# Patient Record
Sex: Male | Born: 1975 | Race: Black or African American | Hispanic: No | Marital: Married | Smoking: Former smoker
Health system: Southern US, Community
[De-identification: ages and names within clinical notes are randomized; demographics above are authoritative.]

## PROBLEM LIST (undated history)

## (undated) DIAGNOSIS — G5600 Carpal tunnel syndrome, unspecified upper limb: Secondary | ICD-10-CM

## (undated) DIAGNOSIS — I10 Essential (primary) hypertension: Secondary | ICD-10-CM

## (undated) HISTORY — DX: Essential (primary) hypertension: I10

## (undated) HISTORY — PX: SPINE SURGERY: SHX786

---

## 1997-10-05 ENCOUNTER — Emergency Department (HOSPITAL_COMMUNITY): Admission: EM | Admit: 1997-10-05 | Discharge: 1997-10-05 | Payer: Self-pay | Admitting: Emergency Medicine

## 1997-10-05 ENCOUNTER — Encounter: Payer: Self-pay | Admitting: Emergency Medicine

## 1998-03-08 ENCOUNTER — Emergency Department (HOSPITAL_COMMUNITY): Admission: EM | Admit: 1998-03-08 | Discharge: 1998-03-08 | Payer: Self-pay | Admitting: Emergency Medicine

## 1999-04-30 ENCOUNTER — Encounter: Payer: Self-pay | Admitting: Emergency Medicine

## 1999-04-30 ENCOUNTER — Emergency Department (HOSPITAL_COMMUNITY): Admission: EM | Admit: 1999-04-30 | Discharge: 1999-04-30 | Payer: Self-pay | Admitting: Emergency Medicine

## 1999-12-10 ENCOUNTER — Emergency Department (HOSPITAL_COMMUNITY): Admission: EM | Admit: 1999-12-10 | Discharge: 1999-12-10 | Payer: Self-pay | Admitting: Emergency Medicine

## 1999-12-15 ENCOUNTER — Encounter: Payer: Self-pay | Admitting: Emergency Medicine

## 1999-12-15 ENCOUNTER — Emergency Department (HOSPITAL_COMMUNITY): Admission: EM | Admit: 1999-12-15 | Discharge: 1999-12-15 | Payer: Self-pay | Admitting: Emergency Medicine

## 1999-12-21 ENCOUNTER — Emergency Department (HOSPITAL_COMMUNITY): Admission: EM | Admit: 1999-12-21 | Discharge: 1999-12-21 | Payer: Self-pay | Admitting: Internal Medicine

## 2000-08-01 ENCOUNTER — Encounter: Payer: Self-pay | Admitting: Emergency Medicine

## 2000-08-01 ENCOUNTER — Emergency Department (HOSPITAL_COMMUNITY): Admission: EM | Admit: 2000-08-01 | Discharge: 2000-08-01 | Payer: Self-pay | Admitting: Emergency Medicine

## 2003-06-26 ENCOUNTER — Emergency Department (HOSPITAL_COMMUNITY): Admission: EM | Admit: 2003-06-26 | Discharge: 2003-06-26 | Payer: Self-pay | Admitting: Emergency Medicine

## 2003-12-04 ENCOUNTER — Emergency Department (HOSPITAL_COMMUNITY): Admission: EM | Admit: 2003-12-04 | Discharge: 2003-12-04 | Payer: Self-pay | Admitting: *Deleted

## 2003-12-16 ENCOUNTER — Emergency Department (HOSPITAL_COMMUNITY): Admission: EM | Admit: 2003-12-16 | Discharge: 2003-12-16 | Payer: Self-pay | Admitting: Family Medicine

## 2003-12-24 ENCOUNTER — Emergency Department (HOSPITAL_COMMUNITY): Admission: EM | Admit: 2003-12-24 | Discharge: 2003-12-24 | Payer: Self-pay

## 2004-01-05 ENCOUNTER — Emergency Department (HOSPITAL_COMMUNITY): Admission: EM | Admit: 2004-01-05 | Discharge: 2004-01-05 | Payer: Self-pay | Admitting: Emergency Medicine

## 2005-01-09 ENCOUNTER — Emergency Department (HOSPITAL_COMMUNITY): Admission: EM | Admit: 2005-01-09 | Discharge: 2005-01-09 | Payer: Self-pay | Admitting: Emergency Medicine

## 2005-05-16 ENCOUNTER — Encounter: Payer: Self-pay | Admitting: Emergency Medicine

## 2005-09-23 ENCOUNTER — Emergency Department (HOSPITAL_COMMUNITY): Admission: EM | Admit: 2005-09-23 | Discharge: 2005-09-23 | Payer: Self-pay | Admitting: Family Medicine

## 2005-09-26 ENCOUNTER — Emergency Department (HOSPITAL_COMMUNITY): Admission: EM | Admit: 2005-09-26 | Discharge: 2005-09-26 | Payer: Self-pay

## 2005-09-26 ENCOUNTER — Emergency Department (HOSPITAL_COMMUNITY): Admission: EM | Admit: 2005-09-26 | Discharge: 2005-09-27 | Payer: Self-pay | Admitting: Emergency Medicine

## 2006-05-03 ENCOUNTER — Emergency Department (HOSPITAL_COMMUNITY): Admission: EM | Admit: 2006-05-03 | Discharge: 2006-05-03 | Payer: Self-pay | Admitting: *Deleted

## 2006-10-15 ENCOUNTER — Emergency Department: Payer: Self-pay | Admitting: Emergency Medicine

## 2006-10-15 ENCOUNTER — Other Ambulatory Visit: Payer: Self-pay

## 2007-03-20 ENCOUNTER — Emergency Department (HOSPITAL_COMMUNITY): Admission: EM | Admit: 2007-03-20 | Discharge: 2007-03-21 | Payer: Self-pay | Admitting: Emergency Medicine

## 2007-03-24 ENCOUNTER — Emergency Department (HOSPITAL_COMMUNITY): Admission: EM | Admit: 2007-03-24 | Discharge: 2007-03-24 | Payer: Self-pay | Admitting: Emergency Medicine

## 2007-05-24 ENCOUNTER — Emergency Department (HOSPITAL_COMMUNITY): Admission: EM | Admit: 2007-05-24 | Discharge: 2007-05-24 | Payer: Self-pay | Admitting: Emergency Medicine

## 2007-10-26 ENCOUNTER — Emergency Department (HOSPITAL_COMMUNITY): Admission: EM | Admit: 2007-10-26 | Discharge: 2007-10-26 | Payer: Self-pay | Admitting: Emergency Medicine

## 2007-11-03 ENCOUNTER — Emergency Department (HOSPITAL_COMMUNITY): Admission: EM | Admit: 2007-11-03 | Discharge: 2007-11-03 | Payer: Self-pay | Admitting: Emergency Medicine

## 2008-08-03 ENCOUNTER — Emergency Department (HOSPITAL_COMMUNITY): Admission: EM | Admit: 2008-08-03 | Discharge: 2008-08-03 | Payer: Self-pay | Admitting: Family Medicine

## 2009-08-10 ENCOUNTER — Emergency Department (HOSPITAL_COMMUNITY): Admission: EM | Admit: 2009-08-10 | Discharge: 2009-08-10 | Payer: Self-pay | Admitting: Emergency Medicine

## 2009-08-30 ENCOUNTER — Ambulatory Visit (HOSPITAL_COMMUNITY): Admission: RE | Admit: 2009-08-30 | Discharge: 2009-08-30 | Payer: Self-pay | Admitting: Orthopedic Surgery

## 2009-12-13 ENCOUNTER — Emergency Department (HOSPITAL_COMMUNITY): Admission: EM | Admit: 2009-12-13 | Discharge: 2009-12-13 | Payer: Self-pay | Admitting: Emergency Medicine

## 2010-01-15 HISTORY — PX: BACK SURGERY: SHX140

## 2010-01-30 LAB — DIFFERENTIAL
Basophils Absolute: 0 10*3/uL (ref 0.0–0.1)
Basophils Relative: 0 % (ref 0–1)
Eosinophils Absolute: 0.1 10*3/uL (ref 0.0–0.7)
Eosinophils Relative: 1 % (ref 0–5)
Lymphocytes Relative: 48 % — ABNORMAL HIGH (ref 12–46)
Lymphs Abs: 2.2 10*3/uL (ref 0.7–4.0)
Monocytes Absolute: 0.3 10*3/uL (ref 0.1–1.0)
Monocytes Relative: 7 % (ref 3–12)
Neutro Abs: 2.1 10*3/uL (ref 1.7–7.7)
Neutrophils Relative %: 44 % (ref 43–77)

## 2010-01-30 LAB — URINALYSIS, ROUTINE W REFLEX MICROSCOPIC
Bilirubin Urine: NEGATIVE
Hgb urine dipstick: NEGATIVE
Ketones, ur: NEGATIVE mg/dL
Nitrite: NEGATIVE
Protein, ur: NEGATIVE mg/dL
Specific Gravity, Urine: 1.011 (ref 1.005–1.030)
Urine Glucose, Fasting: NEGATIVE mg/dL
Urobilinogen, UA: 0.2 mg/dL (ref 0.0–1.0)
pH: 6.5 (ref 5.0–8.0)

## 2010-01-30 LAB — COMPREHENSIVE METABOLIC PANEL
ALT: 34 U/L (ref 0–53)
AST: 21 U/L (ref 0–37)
Albumin: 4.3 g/dL (ref 3.5–5.2)
Alkaline Phosphatase: 57 U/L (ref 39–117)
BUN: 10 mg/dL (ref 6–23)
CO2: 27 mEq/L (ref 19–32)
Calcium: 9.4 mg/dL (ref 8.4–10.5)
Chloride: 104 mEq/L (ref 96–112)
Creatinine, Ser: 1.19 mg/dL (ref 0.4–1.5)
GFR calc Af Amer: >60 mL/min (ref >60)
GFR calc non Af Amer: >60 mL/min (ref >60)
Glucose, Bld: 74 mg/dL (ref 70–99)
Potassium: 4.5 mEq/L (ref 3.5–5.1)
Sodium: 138 mEq/L (ref 135–145)
Total Bilirubin: 0.7 mg/dL (ref 0.3–1.2)
Total Protein: 7.5 g/dL (ref 6.0–8.3)

## 2010-01-30 LAB — SURGICAL PCR SCREEN
MRSA, PCR: NEGATIVE
Staphylococcus aureus: NEGATIVE

## 2010-01-30 LAB — CBC
HCT: 45.6 % (ref 39.0–52.0)
Hemoglobin: 15.2 g/dL (ref 13.0–17.0)
MCH: 27.5 pg (ref 26.0–34.0)
MCHC: 33.3 g/dL (ref 30.0–36.0)
MCV: 82.6 fL (ref 78.0–100.0)
Platelets: 253 10*3/uL (ref 150–400)
RBC: 5.52 MIL/uL (ref 4.22–5.81)
RDW: 13.7 % (ref 11.5–15.5)
WBC: 4.7 10*3/uL (ref 4.0–10.5)

## 2010-01-30 LAB — APTT: aPTT: 40 seconds — ABNORMAL HIGH (ref 24–37)

## 2010-01-30 LAB — PROTIME-INR
INR: 0.99 (ref 0.00–1.49)
Prothrombin Time: 13.3 seconds (ref 11.6–15.2)

## 2010-01-31 ENCOUNTER — Observation Stay (HOSPITAL_COMMUNITY)
Admission: RE | Admit: 2010-01-31 | Discharge: 2010-02-02 | Payer: Self-pay | Source: Home / Self Care | Attending: Orthopedic Surgery | Admitting: Orthopedic Surgery

## 2010-01-31 ENCOUNTER — Encounter (INDEPENDENT_AMBULATORY_CARE_PROVIDER_SITE_OTHER): Payer: Self-pay | Admitting: Orthopedic Surgery

## 2010-02-01 LAB — TYPE AND SCREEN
ABO/RH(D): O POS
Antibody Screen: NEGATIVE

## 2010-02-01 LAB — ABO/RH: ABO/RH(D): O POS

## 2010-02-06 NOTE — Op Note (Signed)
Edwin Cline, Edwin Cline              ACCOUNT NO.:  [id number]  MEDICAL RECORD NO.:  [id number]          PATIENT TYPE:  OIB  LOCATION:  1317                         FACILITY:  [facility]  PHYSICIAN:  Georges Lynch. Cameo Schmiesing, M.D.DATE OF BIRTH:  1975-04-12  DATE OF PROCEDURE:  01/31/2010 DATE OF DISCHARGE:                              OPERATIVE REPORT   SURGEON:  Georges Lynch. Darrelyn Hillock, M.D.  ASSISTANT:  Marlowe Kays, M.D.  PREOPERATIVE DIAGNOSES: 1. Lateral recess stenosis L5-S1, left. 2. Herniated disk, L5-S1, left. 3. 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  POSTOPERATIVE DIAGNOSES: 1. Lateral recess stenosis L5-S1, left. 2. Herniated disk, L5-S1, left. 3. 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  OPERATIONS: 1. Decompressive lateral recess at L5-S1 on the left for lateral     recess stenosis. 2. Microdiskectomy at L5-S1 on the left herniated disk.  PROCEDURE:  Under general anesthesia, routine orthopedic prep anddraping was carried out of the lower back.  The patient was on spinal frame.  Note his back was marked on the left side for the appropriate marking.  Appropriate time-out was carried out.  He had 2 grams of IV Ancef.  After the prep and drape was done, 2 needles were placed in the back for localization purposes.  X-ray was taken.  Following that an incision was made over the L5-S1 interspace.  Bleeders identified and cauterized.  The muscle was separated bilaterally from the lamina and spinous processes mainly for retraction purposes.  The McCullough retractors then were inserted.  We identified the L5-S1 space.  A hemilaminectomy was carried out in the usual fashion.  We did bring the microscope in, removed the ligamentum flavum, protected the dura, and then went out far laterally to decompress the recess.  Was noted at this time that he had a large lateral recess veins that were carefully cauterized and had a small conjoined root.  We identified the  disk space.  A needle was placed in this, an x-ray was taken to verified that we were at L5-S1.  The films were labeled in the operating room for verification.  They were labeled by the radiologist.  At this time, a cruciate incision was made in the posterior longitudinal ligament and a complete microdiskectomy was carried out.  We went up under the ligamentum flavum distally, proximally, medial, and laterally and made sure the root was free.  Went out the lateral recesses root foramen as well.  Following that we made sure that the root was free, did a nice foraminotomy for the S1 root.  We are able to easily pass a hockey-stick down the foramen after we completed the procedure.  Thoroughly irrigated out the area and then loosely applied some thrombin-soaked Gelfoam, closed the wound layers usual fashion except for small deep distal and proximal part of the wound open for drainage purposes.  Subcu was closed with Vicryl, the skin with metal staples, and a sterile Neosporin dressing was applied.  The patient left the operative room in satisfactory condition.          ______________________________ Georges Lynch Darrelyn Hillock, M.D.     RAG/MEDQ  D:  01/31/2010  T:  01/31/2010  Job:  [id number]  Electronically Signed by Ranee Gosselin M.D. on 02/06/2010 11:51:33 AM

## 2010-04-18 ENCOUNTER — Emergency Department (HOSPITAL_COMMUNITY)
Admission: EM | Admit: 2010-04-18 | Discharge: 2010-04-18 | Disposition: A | Discharge status: Home / Self Care | Payer: Self-pay | Attending: Emergency Medicine | Admitting: Emergency Medicine

## 2010-04-18 DIAGNOSIS — R059 Cough, unspecified: Secondary | ICD-10-CM | POA: Insufficient documentation

## 2010-04-18 DIAGNOSIS — G8929 Other chronic pain: Secondary | ICD-10-CM | POA: Insufficient documentation

## 2010-04-18 DIAGNOSIS — R0982 Postnasal drip: Secondary | ICD-10-CM | POA: Insufficient documentation

## 2010-04-18 DIAGNOSIS — J069 Acute upper respiratory infection, unspecified: Secondary | ICD-10-CM | POA: Insufficient documentation

## 2010-04-18 DIAGNOSIS — R05 Cough: Secondary | ICD-10-CM | POA: Insufficient documentation

## 2010-04-18 DIAGNOSIS — IMO0001 Reserved for inherently not codable concepts without codable children: Secondary | ICD-10-CM | POA: Insufficient documentation

## 2010-04-18 DIAGNOSIS — R5381 Other malaise: Secondary | ICD-10-CM | POA: Insufficient documentation

## 2010-04-18 DIAGNOSIS — M549 Dorsalgia, unspecified: Secondary | ICD-10-CM | POA: Insufficient documentation

## 2010-04-18 DIAGNOSIS — J3489 Other specified disorders of nose and nasal sinuses: Secondary | ICD-10-CM | POA: Insufficient documentation

## 2010-05-01 NOTE — H&P (Addendum)
Edwin Cline, Edwin Cline              ACCOUNT NO.:  [id number]  MEDICAL RECORD NO.:  [id number]           PATIENT TYPE:  LOCATION:                                 FACILITY:  PHYSICIAN:  Georges Lynch. Hiedi Touchton, M.D.DATE OF BIRTH:  01-30-1975  DATE OF ADMISSION: DATE OF DISCHARGE:                             HISTORY & PHYSICAL   ADMISSION DIAGNOSIS:  Lumbar disk herniation.  DISCHARGE DIAGNOSIS:  Lumbar disk herniation status post decompressive lateral recess at L5-S1 on the left and microdiskectomy at L5-S1 also on the left for disk herniation.  LABORATORY DATA:  Preoperative CBC revealed a white count of 4.7, hemoglobin 15.2, hematocrit 45.6 and a platelet count of 253. Preoperative INR is 0.99.  Preoperative chemistry panel is unremarkable. Preoperative urinalysis was negative for infection and preoperative PCR for MRSA and staph aureus were both negative.  PROCEDURE:  On January 31, 2010, Mr. Melhorn was taken to the operating room by surgeon Dr. Ranee Gosselin, assistant Dr. Marlowe Kays.  He underwent decompressive lateral recess at L5-S1 on the left for lateral recess stenosis and microdiskectomy at L5-S1 also on the left for disk herniation.  The procedure was performed under general anesthesia. Routine orthopedic prep and drape were carried out.  The patient received 2 g IV Ancef preoperatively.  There were no complications with the procedure.  The patient was returned to the recovery room in satisfactory condition.  HOSPITAL COURSE:  Mr. Almena was admitted to [facility] on January 31, 2010, underwent the above-stated procedure without complication.  After adequate time in the recovery room, he was taken to the floor for further recovery.  He was placed on reduced dose PCA Dilaudid and Robaxin for pain control.  On postoperative day #1, the patient was having quite a bit of trouble with nausea and vomiting throughout the night.  We discontinued the PCA Dilaudid  as this may have been the contributor to his nausea and vomiting.  We also changed his antibiotics from Zofran to Phenergan.  The patient was visited by physical therapy on postoperative day #1.  He was able to ambulate 100 feet using a rolling walker without assistance.  He was able to transfer from sit to stand without assistance.  Postoperative day #2, the patient continued to improve.  His nausea and vomiting had resolved.  Pain was decreased and plan was discharge to home on February 02, 2010.  DISPOSITION:  To home on February 02, 2010.  DISCHARGE MEDICATIONS.: 1. Percocet 10/650 mg 1 tablet p.o. q. 4-6 hours p.r.n. pain. 2. Robaxin 500 mg 1 tablet p.o. q.6 h p.r.n. muscle spasm.  DISCHARGE INSTRUCTIONS:  ACTIVITY:  The patient is to increase his activity slowly.  He should ambulate using a walker.  He is able to shower.  DIET:  No restrictions.  WOUND CARE:  He will need daily dressing change. He should cover the wound with Saran wrap for the next 3 days while he is showering to avoid completely submerging the wound.  FOLLOWUP:  The patient will follow up in the office Dr. Darrelyn Hillock 2 weeks from the day of surgery.  Please contact the office  at [phone] to schedule this appointment.  CONDITION ON DISCHARGE:  Improving.     Rozell Searing, PAC   ______________________________ Georges Lynch Darrelyn Hillock, M.D.    LD/MEDQ  D:  04/27/2010  T:  04/27/2010  Job:  [id number]  Electronically Signed by Rozell Searing  on 05/01/2010 02:23:04 PM Electronically Signed by Ranee Gosselin M.D. on 05/04/2010 08:06:26 AM

## 2010-05-11 ENCOUNTER — Emergency Department (HOSPITAL_COMMUNITY)
Admission: EM | Admit: 2010-05-11 | Discharge: 2010-05-11 | Disposition: A | Discharge status: Home / Self Care | Payer: Self-pay | Attending: Emergency Medicine | Admitting: Emergency Medicine

## 2010-05-11 DIAGNOSIS — K029 Dental caries, unspecified: Secondary | ICD-10-CM | POA: Insufficient documentation

## 2010-05-11 DIAGNOSIS — I10 Essential (primary) hypertension: Secondary | ICD-10-CM | POA: Insufficient documentation

## 2010-05-11 DIAGNOSIS — K047 Periapical abscess without sinus: Secondary | ICD-10-CM | POA: Insufficient documentation

## 2010-10-09 LAB — BASIC METABOLIC PANEL
CO2: 26
Chloride: 104
GFR calc Af Amer: >60
Sodium: 137

## 2010-10-09 LAB — COMPREHENSIVE METABOLIC PANEL
ALT: 21
AST: 37
Albumin: 3.8
Alkaline Phosphatase: 47
BUN: 11
CO2: 25
Calcium: 8.7
Chloride: 103
Creatinine, Ser: 1.16
GFR calc Af Amer: >60
GFR calc non Af Amer: >60
Glucose, Bld: 103 — ABNORMAL HIGH
Potassium: 5.4 — ABNORMAL HIGH
Sodium: 134 — ABNORMAL LOW
Total Bilirubin: 1.3 — ABNORMAL HIGH
Total Protein: 6.5

## 2010-10-09 LAB — DIFFERENTIAL
Basophils Relative: 0
Basophils Relative: 0
Eosinophils Absolute: 0
Eosinophils Relative: 1
Lymphs Abs: 1.2
Monocytes Absolute: 0.4
Monocytes Absolute: 0.2
Monocytes Relative: 8
Monocytes Relative: 3
Neutro Abs: 7.2
Neutrophils Relative %: 62

## 2010-10-09 LAB — I-STAT 8, (EC8 V) (CONVERTED LAB)
Bicarbonate: 25.4 — ABNORMAL HIGH
Glucose, Bld: 100 — ABNORMAL HIGH
TCO2: 27
pCO2, Ven: 40.9 — ABNORMAL LOW
pH, Ven: 7.401 — ABNORMAL HIGH

## 2010-10-09 LAB — CBC
HCT: 44.1
Hemoglobin: 15.3
Hemoglobin: 14.2
MCHC: 34.7
MCHC: 34.2
MCV: 81.9
MCV: 80.2
Platelets: 194
RBC: 5.39
RBC: 5.16
RDW: 13.1
WBC: 4.9
WBC: 8.7

## 2010-10-09 LAB — POTASSIUM: Potassium: 3.8

## 2011-10-27 ENCOUNTER — Encounter (HOSPITAL_COMMUNITY): Payer: Self-pay | Admitting: Emergency Medicine

## 2011-10-27 ENCOUNTER — Emergency Department (HOSPITAL_COMMUNITY)
Admission: EM | Admit: 2011-10-27 | Discharge: 2011-10-27 | Disposition: A | Discharge status: Home / Self Care | Payer: Self-pay | Attending: Emergency Medicine | Admitting: Emergency Medicine

## 2011-10-27 DIAGNOSIS — M549 Dorsalgia, unspecified: Secondary | ICD-10-CM | POA: Insufficient documentation

## 2011-10-27 MED ORDER — METHOCARBAMOL 750 MG PO TABS: 750.0000 mg | ORAL_TABLET | Freq: Four times a day (QID) | ORAL | Status: DC

## 2011-10-27 MED ORDER — OXYCODONE-ACETAMINOPHEN 5-325 MG PO TABS: 2.0000 | ORAL_TABLET | ORAL | Status: DC | PRN

## 2011-10-27 MED ORDER — PREDNISONE 10 MG PO TABS: 20.0000 mg | ORAL_TABLET | Freq: Every day | ORAL | Status: DC

## 2011-10-27 NOTE — ED Provider Notes (Signed)
History     CSN: [id number]  Arrival date & time 10/27/11  4098   First MD Initiated Contact with Patient 10/27/11 0710      Chief Complaint  Patient presents with  . Back Pain    (Consider location/radiation/quality/duration/timing/severity/associated sxs/prior treatment) Patient is a 36 y.o. male presenting with back pain. The history is provided by the patient.  Back Pain    patient here complaining of lower back pain x2 days. History of prior back surgery year ago. Denies any urinary retention or bowel incontinence. Pain starts in his left lower paraspinal area and radiates down his legs. Patient recently started a new job that involves heavy lifting. Patient denies any foot drop. Has been using medications prior to arrival. Denies any hematuria or dysuria. No fever chills  History reviewed. No pertinent past medical history.  Past Surgical History  Procedure Date  . Back surgery     No family history on file.  History  Substance Use Topics  . Smoking status: Never Smoker   . Smokeless tobacco: Not on file  . Alcohol Use: No      Review of Systems  Musculoskeletal: Positive for back pain.  All other systems reviewed and are negative.    Allergies  Review of patient's allergies indicates no known allergies.  Home Medications  No current outpatient prescriptions on file.  BP 121/74  Pulse 66  Temp 98 F (36.7 C)  Resp 16  Wt 200 lb (90.719 kg)  SpO2 99%  Physical Exam  Nursing note and vitals reviewed. Constitutional: He is oriented to person, place, and time. He appears well-developed and well-nourished.  Non-toxic appearance. No distress.  HENT:  Head: Normocephalic and atraumatic.  Eyes: Conjunctivae normal, EOM and lids are normal. Pupils are equal, round, and reactive to light.  Neck: Normal range of motion. Neck supple. No tracheal deviation present. No mass present.  Cardiovascular: Normal rate, regular rhythm and normal heart sounds.  Exam  reveals no gallop.   No murmur heard. Pulmonary/Chest: Effort normal and breath sounds normal. No stridor. No respiratory distress. He has no decreased breath sounds. He has no wheezes. He has no rhonchi. He has no rales.  Abdominal: Soft. Normal appearance and bowel sounds are normal. He exhibits no distension. There is no tenderness. There is no rebound and no CVA tenderness.  Musculoskeletal: Normal range of motion. He exhibits no edema and no tenderness.       Arms: Neurological: He is alert and oriented to person, place, and time. He has normal strength. No cranial nerve deficit or sensory deficit. GCS eye subscore is 4. GCS verbal subscore is 5. GCS motor subscore is 6.  Skin: Skin is warm and dry. No abrasion and no rash noted.  Psychiatric: He has a normal mood and affect. His speech is normal and behavior is normal.    ED Course  Procedures (including critical care time)  Labs Reviewed - No data to display No results found.   No diagnosis found.    MDM  Patient to be treated for muscular skeletal back pain. No acute neurological emergency exists. He will followup with [place] by surgeon as needed        Toy Baker, MD 10/27/11 [phone]

## 2011-10-27 NOTE — ED Notes (Signed)
Pt alert, arrives from home, c/o low back pain, recently changed jobs, states sx last year on back, resp even unlabored, skin pwd, ambulates to triage, steady gait noted

## 2011-11-01 ENCOUNTER — Encounter (HOSPITAL_COMMUNITY): Payer: Self-pay | Admitting: Emergency Medicine

## 2011-11-01 ENCOUNTER — Emergency Department (INDEPENDENT_AMBULATORY_CARE_PROVIDER_SITE_OTHER)
Admission: EM | Admit: 2011-11-01 | Discharge: 2011-11-01 | Disposition: A | Discharge status: Home / Self Care | Payer: Self-pay | Source: Home / Self Care | Attending: Emergency Medicine | Admitting: Emergency Medicine

## 2011-11-01 DIAGNOSIS — IMO0002 Reserved for concepts with insufficient information to code with codable children: Secondary | ICD-10-CM

## 2011-11-01 MED ORDER — OXYCODONE-ACETAMINOPHEN 5-325 MG PO TABS: ORAL_TABLET | ORAL | Status: DC

## 2011-11-01 MED ORDER — CYCLOBENZAPRINE HCL 5 MG PO TABS: 5.0000 mg | ORAL_TABLET | Freq: Three times a day (TID) | ORAL | Status: DC | PRN

## 2011-11-01 MED ORDER — MELOXICAM 15 MG PO TABS: 15.0000 mg | ORAL_TABLET | Freq: Every day | ORAL | Status: DC

## 2011-11-01 NOTE — ED Notes (Signed)
Waiting discharge papers 

## 2011-11-01 NOTE — ED Provider Notes (Signed)
Chief Complaint  Patient presents with  . Tailbone Pain    History of Present Illness:   Edwin Cline is a 36 year old male who presents today with left lower back pain radiating down his left leg. This is been going on since an on-the-job injury in July 2011. He was doing some heavy lifting, moving furniture. 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 He's been followed by Dr. Darrelyn Hillock since then. He had a microdiscectomy in January 2012. Thereafter he had to quit working because of the pain and therefore lost his insurance. Because of that he's not been able to get back to see Dr. Darrelyn Hillock. He describes pain in his left lower back with radiation down the left leg as far as the ankle. The leg feels numb and tingly at times he also has cramping and weakness of the legs. He denies any bladder or bowel complaints, incontinence, or saddle anesthesia. He's had no fever, chills, or unintentional weight loss. Dr. Darrelyn Hillock has been prescribing Flexeril and Percocet which does help some. Since he can't see Dr. Darrelyn Hillock anymore, he needs prescriptions on these. He's not taking any anti-inflammatories.  Review of Systems:  Other than noted above, the patient denies any of the following symptoms: Systemic:  No fever, chills, severe fatigue, or unexplained weight loss. GI:  No abdominal pain, nausea, vomiting, diarrhea, constipation, incontinence of bowel, or blood in stool. GU:  No dysuria, frequency, urgency, or hematuria. No incontinence of urine or difficulty urinating.  M-S:  No neck pain, joint pain, arthritis, or myalgias. Neuro:  No paresthesias, saddle anesthesia, muscular weakness, or progressive neurological deficit.  PMFSH:  Past medical history, family history, social history, meds, and allergies were reviewed. Specifically, there is no history of cancer, major trauma, osteoporosis, immunosuppression, HIV, or IV or injection drug use.   Physical Exam:   Vital signs:  BP 133/74  Pulse 60  Temp 98.3 F (36.8 C) (Oral)   Resp 20  SpO2 100% General:  Alert, oriented, in no distress. Abdomen:  Soft, non-tender.  No organomegaly or mass.  No pulsatile midline abdominal mass or bruit. Back:  There is tenderness to palpation in the left lower back, centering just above the iliac crest extending up to the CVA and down to the sacroiliac. He has a midline laminectomy scar which is well-healed. His back has a fairly good range of motion with 60 of flexion, 10 extension, 20 lateral bending, and 90 of rotation in each direction with pain. Pain is most pronounced with extension. Straight leg raising is positive on the left and negative on the right. Neuro:  Normal muscle strength, sensations and DTRs. Extremities: Pedal pulses were full, there was no edema. Skin:  Clear, warm and dry.  No rash.  Assessment:  The encounter diagnosis was Degenerative disc disease.  Plan:   1.  The following meds were prescribed:   New Prescriptions   CYCLOBENZAPRINE (FLEXERIL) 5 MG TABLET    Take 1 tablet (5 mg total) by mouth 3 (three) times daily as needed for muscle spasms.   MELOXICAM (MOBIC) 15 MG TABLET    Take 1 tablet (15 mg total) by mouth daily.   OXYCODONE-ACETAMINOPHEN (PERCOCET) 5-325 MG PER TABLET    1 to 2 tablets every 6 hours as needed for pain.   2.  The patient was instructed in symptomatic care and handouts were given. The patient was encouraged to find a primary care physician for prescription on his medication. I explained to him that we did not  treat chronic pain with opioid analgesics, but he was given a few Percocet to tide him over until he can get in to see a primary care specialist. 3.  The patient was told to return if becoming worse in any way, if no better in 2 weeks, and given some red flag symptoms that would indicate earlier return. 4.  The patient was encouraged to try to be as active as possible and given some exercises to do followed by moist heat.    Reuben Likes, MD 11/01/11 [phone]

## 2011-11-01 NOTE — ED Notes (Addendum)
Pt c/o lower back pain. Recent back surgery 01/2010 on L5 and S1. Pt states that the pain is controlled when taking pain meds and muscle relaxer but is currently out of meds.   Pt was recently seen in hospital for pain and was prescribed percocet but only given 10 pills.  Pt states that the pain is severe and recently had to stop working because of the constant back pain and is applying for disability.

## 2011-11-21 ENCOUNTER — Emergency Department (HOSPITAL_COMMUNITY)
Admission: EM | Admit: 2011-11-21 | Discharge: 2011-11-21 | Disposition: A | Discharge status: Home / Self Care | Payer: Self-pay | Attending: Emergency Medicine | Admitting: Emergency Medicine

## 2011-11-21 ENCOUNTER — Encounter (HOSPITAL_COMMUNITY): Payer: Self-pay | Admitting: Emergency Medicine

## 2011-11-21 DIAGNOSIS — M549 Dorsalgia, unspecified: Secondary | ICD-10-CM

## 2011-11-21 DIAGNOSIS — M545 Low back pain, unspecified: Secondary | ICD-10-CM | POA: Insufficient documentation

## 2011-11-21 DIAGNOSIS — G8929 Other chronic pain: Secondary | ICD-10-CM | POA: Insufficient documentation

## 2011-11-21 LAB — POCT I-STAT, CHEM 8
Calcium, Ion: 1.2 mmol/L (ref 1.12–1.23)
Creatinine, Ser: 1.3 mg/dL (ref 0.50–1.35)
Glucose, Bld: 91 mg/dL (ref 70–99)
HCT: 47 % (ref 39.0–52.0)
Hemoglobin: 16 g/dL (ref 13.0–17.0)
Potassium: 4.5 mEq/L (ref 3.5–5.1)
TCO2: 25 mmol/L (ref 0–100)

## 2011-11-21 LAB — CBC WITH DIFFERENTIAL/PLATELET
Basophils Relative: 0 % (ref 0–1)
Eosinophils Absolute: 0 10*3/uL (ref 0.0–0.7)
Eosinophils Relative: 1 % (ref 0–5)
Hemoglobin: 14.9 g/dL (ref 13.0–17.0)
Lymphs Abs: 1.7 10*3/uL (ref 0.7–4.0)
MCH: 27.5 pg (ref 26.0–34.0)
MCHC: 33.9 g/dL (ref 30.0–36.0)
MCV: 81 fL (ref 78.0–100.0)
Monocytes Absolute: 0.3 10*3/uL (ref 0.1–1.0)
Monocytes Relative: 6 % (ref 3–12)
Neutrophils Relative %: 55 % (ref 43–77)
RBC: 5.42 MIL/uL (ref 4.22–5.81)

## 2011-11-21 MED ORDER — OXYCODONE-ACETAMINOPHEN 10-325 MG PO TABS: 1.0000 | ORAL_TABLET | ORAL | Status: DC | PRN

## 2011-11-21 MED ORDER — OXYCODONE-ACETAMINOPHEN 5-325 MG PO TABS
2.0000 | ORAL_TABLET | Freq: Once | ORAL | Status: AC
  Administered 2011-11-21: 2 via ORAL
  Filled 2011-11-21: qty 2

## 2011-11-21 MED ORDER — METHOCARBAMOL 500 MG PO TABS: 500.0000 mg | ORAL_TABLET | Freq: Four times a day (QID) | ORAL | Status: DC

## 2011-11-21 MED ORDER — METHOCARBAMOL 500 MG PO TABS
750.0000 mg | ORAL_TABLET | Freq: Once | ORAL | Status: AC
  Administered 2011-11-21: 750 mg via ORAL
  Filled 2011-11-21: qty 2

## 2011-11-21 NOTE — ED Provider Notes (Signed)
History     CSN: [id number]  Arrival date & time 11/21/11  4098   First MD Initiated Contact with Patient 11/21/11 [phone]      Chief Complaint  Patient presents with  . Back Pain    (Consider location/radiation/quality/duration/timing/severity/associated sxs/prior treatment) HPI Comments: Patient is a 36 year old male with a history of chronic back pain that presents with acute on chronic back pain for the past 2 weeks. Patient reports a gradual onset and progression of pain that seems to be exacerbated by heavy lifting and activity at his job. The pain is sore, constant, and severe. The pain is located in his left lower back and radiates down his left leg. The patient reports associated numbness and tingling of left leg. Patient reports pain is well-controlled with Percocet and Flexeril that he has taken previously for chronic pain but recently experienced a period of time where he did not have health insurance and could not get his prescriptions. Patient denies alleviating factors. Patient denies fever/chills, headache, neck pain/stiffness, NVD, chest pain, SOB, abdominal pain, dysuria, saddle paresthesias, bowel/bladder incontinence.   Patient is a 36 y.o. male presenting with back pain.  Back Pain     History reviewed. No pertinent past medical history.  Past Surgical History  Procedure Date  . Back surgery     No family history on file.  History  Substance Use Topics  . Smoking status: Never Smoker   . Smokeless tobacco: Not on file  . Alcohol Use: No      Review of Systems  Musculoskeletal: Positive for back pain.  All other systems reviewed and are negative.    Allergies  Review of patient's allergies indicates no known allergies.  Home Medications   Current Outpatient Rx  Name  Route  Sig  Dispense  Refill  . CYCLOBENZAPRINE HCL 5 MG PO TABS   Oral   Take 1 tablet (5 mg total) by mouth 3 (three) times daily as needed for muscle spasms.   30 tablet   3   . OXYCODONE-ACETAMINOPHEN 5-325 MG PO TABS   Oral   Take 2 tablets by mouth every 4 (four) hours as needed for pain.   10 tablet   0     BP 126/80  Pulse 63  Temp 97.2 F (36.2 C) (Oral)  Resp 16  Ht 6\' 2"  (1.88 m)  Wt 189 lb (85.73 kg)  BMI 24.27 kg/m2  SpO2 98%  Physical Exam  Nursing note and vitals reviewed. Constitutional: He is oriented to person, place, and time. He appears well-developed and well-nourished. No distress.  HENT:  Head: Normocephalic and atraumatic.  Eyes: Conjunctivae normal are normal.  Neck: Normal range of motion. Neck supple.  Cardiovascular: Normal rate, regular rhythm and intact distal pulses.  Exam reveals no gallop and no friction rub.   No murmur heard.      Bilateral lower extremities warm with sufficient capillary refill. Distal pulses intact.   Pulmonary/Chest: Effort normal and breath sounds normal. He has no wheezes. He has no rales. He exhibits no tenderness.  Abdominal: Soft. There is no tenderness.  Musculoskeletal: Normal range of motion.       Straight leg raise positive on the left. Left lumbosacral area tender to palpation lateral of spine. No midline spine tenderness to palpation or step off noted. Bilateral hips and knee have full ROM, non tender and without edema.   Neurological: He is alert and oriented to person, place, and time. Coordination normal.  Strength equal and intact bilaterally. Sensation slightly diminished in left leg. Speech is goal-oriented. Moves limbs without ataxia.   Skin: Skin is warm and dry. He is not diaphoretic.  Psychiatric: He has a normal mood and affect. His behavior is normal.    ED Course  Procedures (including critical care time)   Labs Reviewed  CBC WITH DIFFERENTIAL  POCT I-STAT, CHEM 8   No results found.   1. Chronic back pain       MDM  8:59 AM Patient will have Percocet and Robaxin for back pain. Labs pending to evaluate any electrolyte imbalance that would cause the  patient's leg cramping. If labs are unremarkable, patient can be discharged with pain control. He has a follow up appointment in 5 days for his back pain.   9:41 AM Labs unremarkable for electrolyte abnormalities or anemia. Patient will be discharged with pain medication. He will follow up with his doctor in 5 days. No saddle paresthesias, bladder/bowel incontinence. Patient will return with worsening or concerning symptoms.       Emilia Beck, PA-C 11/21/11 1001

## 2011-11-21 NOTE — ED Notes (Signed)
Patient claims had microdiscectomy in January 2012.  Patient claims he had relief from surgery until first of October with heavy lifting and activity at his job.  Patient claims demanding job that is making him have L back pain with radiation down left leg.

## 2011-11-21 NOTE — ED Provider Notes (Signed)
Medical screening examination/treatment/procedure(s) were performed by non-physician practitioner and as supervising physician I was immediately available for consultation/collaboration.   Romari Gasparro, MD 11/21/11 1553 

## 2011-11-23 IMAGING — CR DG OR PORTABLE SPINE
1 series · 1 of 1 positions shown · non-contrast
Comparison: Lumbar spine plain films 01/24/2010.

CLINICAL DATA: L5-L1 discectomy.

PORTABLE SPINE

[lateral]
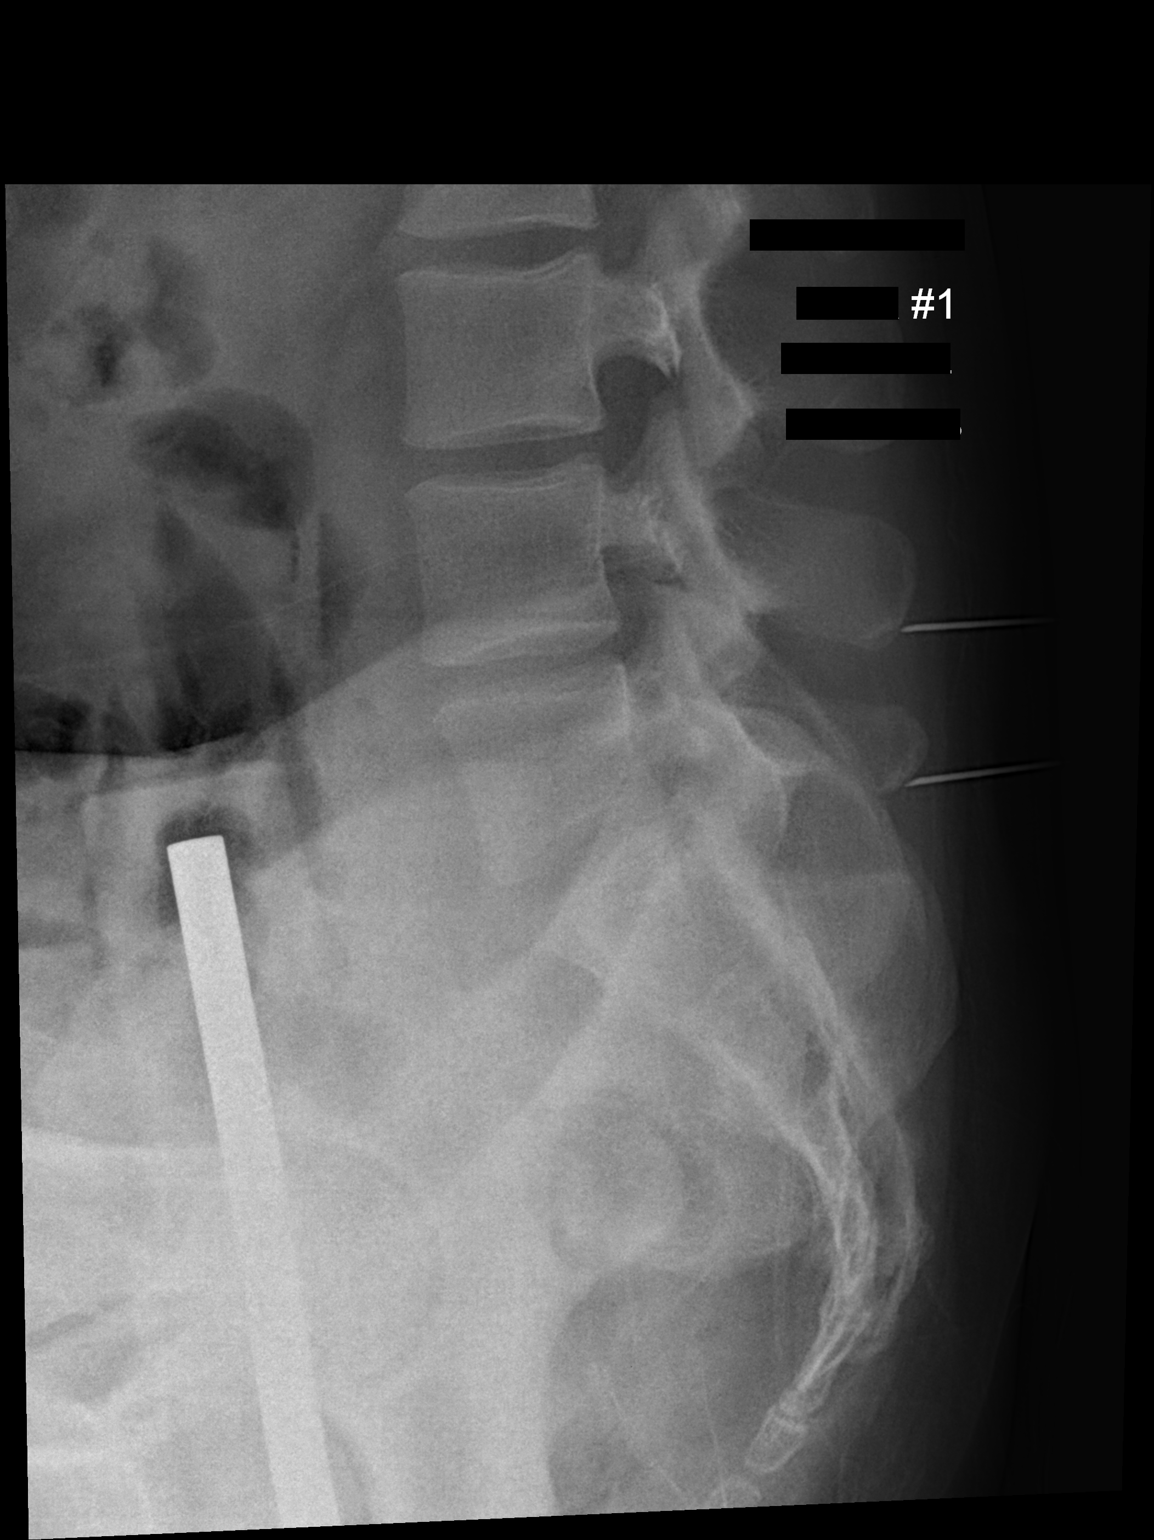

[1 of 1 positions shown; findings below may reference images not displayed]

FINDINGS: We are provided with a single intraoperative view of the
lumbar spine in the lateral projection.  Two metallic probes are in
place.  The more superior is at the level of the inferior aspect of
the L4 spinous process.  The second probe is at the level of the
inferior aspect of the L5 spinous process.
IMPRESSION: Localization as above.

## 2011-11-23 IMAGING — CR DG OR PORTABLE SPINE
1 series · 1 of 1 positions shown · non-contrast
Comparison: Portable films earlier today

CLINICAL DATA: Back pain

PORTABLE SPINE

[lateral]
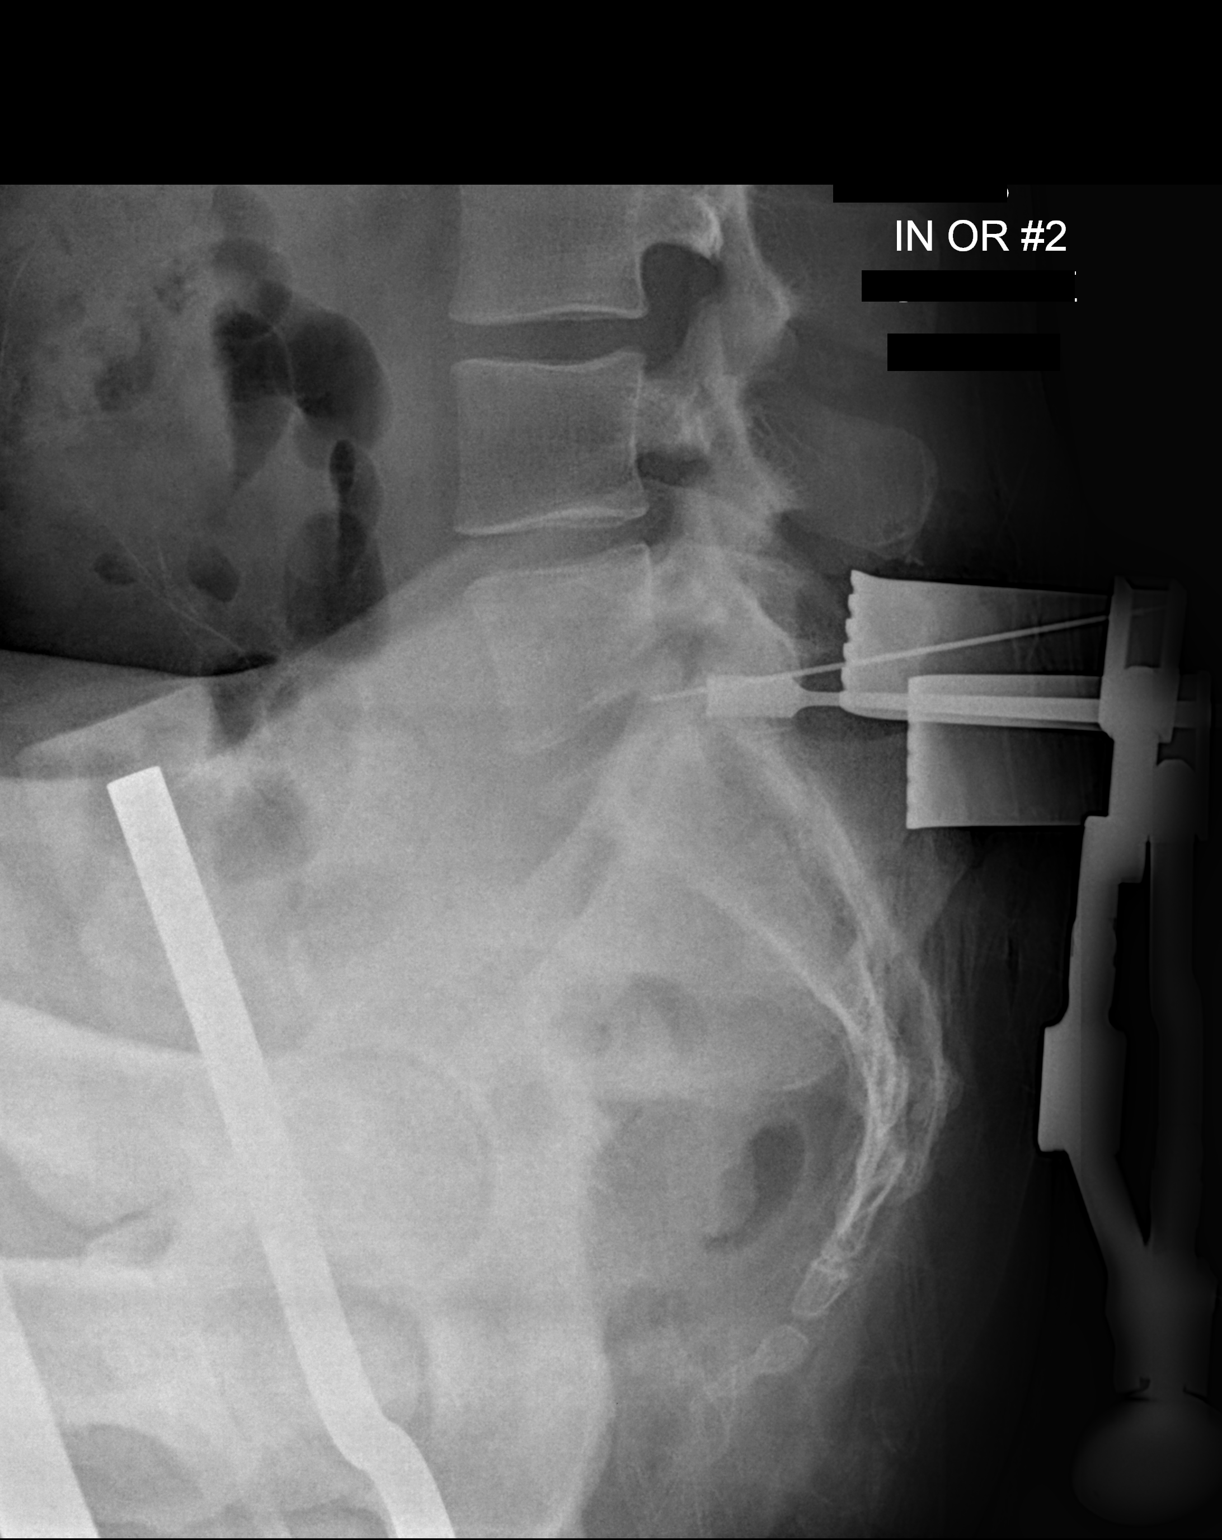

[1 of 1 positions shown; findings below may reference images not displayed]

FINDINGS: Intraoperative lateral film at 2666 hours demonstrates a
needle directed most closely toward the L5-S1 interspace.
IMPRESSION: As above.

## 2011-12-17 ENCOUNTER — Ambulatory Visit: Payer: Self-pay | Admitting: Family Medicine

## 2012-01-29 ENCOUNTER — Emergency Department (HOSPITAL_COMMUNITY)
Admission: EM | Admit: 2012-01-29 | Discharge: 2012-01-29 | Disposition: A | Discharge status: Home / Self Care | Payer: Self-pay | Attending: Emergency Medicine | Admitting: Emergency Medicine

## 2012-01-29 ENCOUNTER — Encounter (HOSPITAL_COMMUNITY): Payer: Self-pay | Admitting: *Deleted

## 2012-01-29 DIAGNOSIS — K047 Periapical abscess without sinus: Secondary | ICD-10-CM | POA: Insufficient documentation

## 2012-01-29 DIAGNOSIS — K0889 Other specified disorders of teeth and supporting structures: Secondary | ICD-10-CM

## 2012-01-29 DIAGNOSIS — K089 Disorder of teeth and supporting structures, unspecified: Secondary | ICD-10-CM | POA: Insufficient documentation

## 2012-01-29 MED ORDER — HYDROCODONE-ACETAMINOPHEN 10-500 MG PO TABS: 1.0000 | ORAL_TABLET | Freq: Four times a day (QID) | ORAL | Status: DC | PRN

## 2012-01-29 MED ORDER — IBUPROFEN 800 MG PO TABS: 800.0000 mg | ORAL_TABLET | Freq: Three times a day (TID) | ORAL | Status: DC | PRN

## 2012-01-29 MED ORDER — PENICILLIN V POTASSIUM 500 MG PO TABS: 500.0000 mg | ORAL_TABLET | Freq: Four times a day (QID) | ORAL | Status: DC

## 2012-01-29 MED ORDER — IBUPROFEN 400 MG PO TABS
800.0000 mg | ORAL_TABLET | Freq: Once | ORAL | Status: AC
  Administered 2012-01-29: 800 mg via ORAL
  Filled 2012-01-29: qty 2

## 2012-01-29 MED ORDER — OXYCODONE-ACETAMINOPHEN 5-325 MG PO TABS
1.0000 | ORAL_TABLET | Freq: Once | ORAL | Status: AC
  Administered 2012-01-29: 1 via ORAL
  Filled 2012-01-29: qty 1

## 2012-01-29 NOTE — ED Notes (Signed)
Pt is here with right bottom tooth pain and reports pain with swelling for last few days

## 2012-01-29 NOTE — ED Provider Notes (Signed)
History     CSN: [id number]  Arrival date & time 01/29/12  1045   First MD Initiated Contact with Patient 01/29/12 1129      Chief Complaint  Patient presents with  . Dental Pain    (Consider location/radiation/quality/duration/timing/severity/associated sxs/prior treatment) HPI Patient presents to the emergency department with dental pain for the last 2 days.  Patient states that his wound, right lower molar is damaged and decayed.  Patient states that palpation makes the pain worse along with air hitting the tooth.  Patient denies nausea, vomiting, fever, headache, blurred vision, chest pain, shortness of breath, neck pain, or neck swelling.  Patient denies taking anything prior to arrival for his symptoms         History reviewed. No pertinent past medical history.  Past Surgical History  Procedure Date  . Back surgery     No family history on file.  History  Substance Use Topics  . Smoking status: Never Smoker   . Smokeless tobacco: Not on file  . Alcohol Use: No      Review of Systems All other systems negative except as documented in the HPI. All pertinent positives and negatives as reviewed in the HPI. Allergies  Review of patient's allergies indicates no known allergies.  Home Medications   Current Outpatient Rx  Name  Route  Sig  Dispense  Refill  . IBUPROFEN 200 MG PO TABS   Oral   Take 400 mg by mouth every 6 (six) hours as needed. For pain         . OXYCODONE-ACETAMINOPHEN 10-325 MG PO TABS   Oral   Take 1 tablet by mouth every 4 (four) hours as needed. For pain           BP 143/95  Pulse 60  Temp 98.3 F (36.8 C) (Oral)  Resp 18  SpO2 99%  Physical Exam  Nursing note and vitals reviewed. Constitutional: He appears well-developed and well-nourished. No distress.  HENT:  Mouth/Throat: No oropharyngeal exudate.    Eyes: Pupils are equal, round, and reactive to light.  Cardiovascular: Normal rate and regular rhythm.     Pulmonary/Chest: Effort normal. No respiratory distress.  Skin: Skin is warm and dry. No rash noted.    ED Course  Procedures (including critical care time)  Patient be referred to dentistry and told to return to the emergency department as needed.  Patient is advised to rinse with warm water and peroxide 3 times a day.  MDM         Carlyle Dolly, PA-C 01/29/12 1155

## 2012-01-29 NOTE — ED Provider Notes (Signed)
Medical screening examination/treatment/procedure(s) were performed by non-physician practitioner and as supervising physician I was immediately available for consultation/collaboration.   Maximina Pirozzi R Jeremie Giangrande, MD 01/29/12 1158 

## 2012-09-30 ENCOUNTER — Emergency Department (HOSPITAL_COMMUNITY)
Admission: EM | Admit: 2012-09-30 | Discharge: 2012-09-30 | Disposition: A | Discharge status: Home / Self Care | Payer: Self-pay | Attending: Emergency Medicine | Admitting: Emergency Medicine

## 2012-09-30 ENCOUNTER — Encounter (HOSPITAL_COMMUNITY): Payer: Self-pay | Admitting: Emergency Medicine

## 2012-09-30 DIAGNOSIS — K0889 Other specified disorders of teeth and supporting structures: Secondary | ICD-10-CM

## 2012-09-30 DIAGNOSIS — Z791 Long term (current) use of non-steroidal anti-inflammatories (NSAID): Secondary | ICD-10-CM | POA: Insufficient documentation

## 2012-09-30 DIAGNOSIS — K002 Abnormalities of size and form of teeth: Secondary | ICD-10-CM | POA: Insufficient documentation

## 2012-09-30 DIAGNOSIS — K089 Disorder of teeth and supporting structures, unspecified: Secondary | ICD-10-CM | POA: Insufficient documentation

## 2012-09-30 DIAGNOSIS — R220 Localized swelling, mass and lump, head: Secondary | ICD-10-CM | POA: Insufficient documentation

## 2012-09-30 MED ORDER — HYDROCODONE-ACETAMINOPHEN 5-325 MG PO TABS: 1.0000 | ORAL_TABLET | Freq: Four times a day (QID) | ORAL | Status: DC | PRN

## 2012-09-30 MED ORDER — PENICILLIN V POTASSIUM 500 MG PO TABS: 500.0000 mg | ORAL_TABLET | Freq: Four times a day (QID) | ORAL | Status: AC

## 2012-09-30 NOTE — Progress Notes (Signed)
[facility] provided pt with a list of primary care resources and dental resources.  °

## 2012-09-30 NOTE — ED Provider Notes (Signed)
Medical screening examination/treatment/procedure(s) were performed by non-physician practitioner and as supervising physician I was immediately available for consultation/collaboration.    Vida Roller, MD 09/30/12 [phone]

## 2012-09-30 NOTE — ED Notes (Signed)
Pt reports 2 month hx of upper r/dental pain. Pain increased over last few days

## 2012-09-30 NOTE — ED Provider Notes (Signed)
CSN: [id number]     Arrival date & time 09/30/12  1222 History   First MD Initiated Contact with Patient 09/30/12 1250     Chief Complaint  Patient presents with  . Dental Pain    r/upper mouth pain x 2 months   (Consider location/radiation/quality/duration/timing/severity/associated sxs/prior Treatment) HPI Comments: Patient presents with a chief complaint of right upper and lower dental pain that has been present intermittently for the past 2 months.  He states that his pain returned today.  He denies any acute dental injury or trauma.  He has been taking Ibuprofen and Aleve for the pain without relief.  He currently does not have a dentist.  Patient is a 37 y.o. male presenting with tooth pain. The history is provided by the patient.  Dental Pain Onset quality:  Gradual Progression:  Worsening Exacerbated by: chewing. Associated symptoms: gum swelling   Associated symptoms: no difficulty swallowing, no drooling, no facial pain, no facial swelling, no fever, no neck pain, no neck swelling and no trismus     History reviewed. No pertinent past medical history. Past Surgical History  Procedure Laterality Date  . Back surgery     Family History  Problem Relation Age of Onset  . Diabetes Father    History  Substance Use Topics  . Smoking status: Never Smoker   . Smokeless tobacco: Not on file  . Alcohol Use: No    Review of Systems  Constitutional: Negative for fever.  HENT: Positive for dental problem. Negative for facial swelling, drooling and neck pain.   All other systems reviewed and are negative.    Allergies  Review of patient's allergies indicates no known allergies.  Home Medications   Current Outpatient Rx  Name  Route  Sig  Dispense  Refill  . naproxen sodium (ANAPROX) 220 MG tablet   Oral   Take 220 mg by mouth 2 (two) times daily with a meal.          BP 128/89  Pulse 80  Temp(Src) 98.4 F (36.9 C) (Oral)  Resp 18  Wt 190 lb (86.183 kg)  BMI  24.38 kg/m2  SpO2 100% Physical Exam  Nursing note and vitals reviewed. Constitutional: He is oriented to person, place, and time. He appears well-developed and well-nourished. No distress.  HENT:  Head: Normocephalic and atraumatic.  Mouth/Throat: Uvula is midline, oropharynx is clear and moist and mucous membranes are normal. No trismus in the jaw. Abnormal dentition. No dental abscesses or edematous. No posterior oropharyngeal edema, posterior oropharyngeal erythema or tonsillar abscesses.    Poor dental hygiene. Pt able to open and close mouth with out difficulty. Airway intact. Uvula midline. Mild gingival swelling with tenderness over affected area, but no fluctuance. No swelling or tenderness of submental and submandibular regions.  No sublingual tenderness to palpation.  No tongue elevation.  Neck: Normal range of motion and full passive range of motion without pain. Neck supple.  Cardiovascular: Normal rate, regular rhythm and normal heart sounds.   Pulmonary/Chest: Effort normal and breath sounds normal.  Musculoskeletal: Normal range of motion.  Lymphadenopathy:       Head (right side): No submental, no submandibular, no tonsillar, no preauricular and no posterior auricular adenopathy present.       Head (left side): No submental, no submandibular, no tonsillar, no preauricular and no posterior auricular adenopathy present.    He has no cervical adenopathy.  Neurological: He is alert and oriented to person, place, and time.  Skin: Skin  is warm and dry. He is not diaphoretic.  Psychiatric: He has a normal mood and affect.    ED Course  Procedures (including critical care time) Labs Review Labs Reviewed - No data to display Imaging Review No results found.  MDM  No diagnosis found. Patient with toothache.  No gross abscess.  Exam unconcerning for Ludwig's angina or spread of infection.  Will treat with penicillin and pain medicine.  Urged patient to follow-up with dentist.        Pascal Lux [place], PA-C 09/30/12 [phone]

## 2012-10-13 ENCOUNTER — Encounter (HOSPITAL_COMMUNITY): Payer: Self-pay

## 2012-10-13 ENCOUNTER — Emergency Department (HOSPITAL_COMMUNITY)
Admission: EM | Admit: 2012-10-13 | Discharge: 2012-10-13 | Disposition: A | Discharge status: Home / Self Care | Payer: Self-pay | Attending: Emergency Medicine | Admitting: Emergency Medicine

## 2012-10-13 DIAGNOSIS — M545 Low back pain, unspecified: Secondary | ICD-10-CM | POA: Insufficient documentation

## 2012-10-13 DIAGNOSIS — M549 Dorsalgia, unspecified: Secondary | ICD-10-CM

## 2012-10-13 DIAGNOSIS — R209 Unspecified disturbances of skin sensation: Secondary | ICD-10-CM | POA: Insufficient documentation

## 2012-10-13 DIAGNOSIS — G8929 Other chronic pain: Secondary | ICD-10-CM | POA: Insufficient documentation

## 2012-10-13 DIAGNOSIS — Z8719 Personal history of other diseases of the digestive system: Secondary | ICD-10-CM | POA: Insufficient documentation

## 2012-10-13 MED ORDER — OXYCODONE-ACETAMINOPHEN 5-325 MG PO TABS: 1.0000 | ORAL_TABLET | ORAL | Status: DC | PRN

## 2012-10-13 MED ORDER — KETOROLAC TROMETHAMINE 30 MG/ML IJ SOLN
30.0000 mg | Freq: Once | INTRAMUSCULAR | Status: DC
  Filled 2012-10-13: qty 1

## 2012-10-13 MED ORDER — OXYCODONE-ACETAMINOPHEN 5-325 MG PO TABS
1.0000 | ORAL_TABLET | Freq: Once | ORAL | Status: AC
  Administered 2012-10-13: 1 via ORAL
  Filled 2012-10-13: qty 1

## 2012-10-13 MED ORDER — DEXAMETHASONE SODIUM PHOSPHATE 10 MG/ML IJ SOLN
10.0000 mg | Freq: Once | INTRAMUSCULAR | Status: AC
  Administered 2012-10-13: 10 mg via INTRAMUSCULAR
  Filled 2012-10-13: qty 1

## 2012-10-13 NOTE — ED Provider Notes (Signed)
CSN: [id number]     Arrival date & time 10/13/12  4098 History   First MD Initiated Contact with Patient 10/13/12 1001     Chief Complaint  Patient presents with  . Back Pain    HPI  Edwin Cline is a 37 y.o. male with a PMH of back surgery who presents to the ED for evaluation of back pain.  History was provided by the patient.  Patient states he's had chronic back pain since 2012 He states he had back surgery with Dr. Myra Cline in 2012.  He states that he has been having worsening back pain over the past month with no acute changes or hx of trauma. He ran out of his medication which was Percocet. He denies any injuries or trauma.  He states he's had left leg numbness at baseline with no loss of sensation, loss of bowel/bladder function, or weakness.  His pain is located in the left lower lumbar region and is described as a shooting constant pain worse with movement.  He hs otherwise been well with no fevers, chills, change in appetite/activity, rhinorrhea, congestion, chest pain, SOB, abdominal pain, dysuria, hematuria, leg edema, headache, dizziness, or lightheadedness.     History reviewed. No pertinent past medical history. Past Surgical History  Procedure Laterality Date  . Back surgery     Family History  Problem Relation Age of Onset  . Diabetes Father    History  Substance Use Topics  . Smoking status: Never Smoker   . Smokeless tobacco: Not on file  . Alcohol Use: No    Review of Systems  Constitutional: Negative for fever, chills, activity change, appetite change and fatigue.  HENT: Negative for ear pain, sore throat, facial swelling, neck pain and neck stiffness.   Eyes: Negative for visual disturbance.  Respiratory: Negative for cough and shortness of breath.   Cardiovascular: Negative for chest pain and leg swelling.  Gastrointestinal: Negative for nausea, vomiting and abdominal pain.  Genitourinary: Negative for dysuria and hematuria.  Musculoskeletal: Positive for  back pain. Negative for myalgias, joint swelling, arthralgias and gait problem.  Skin: Negative for rash and wound.  Neurological: Positive for numbness. Negative for dizziness, syncope, weakness, light-headedness and headaches.    Allergies  Review of patient's allergies indicates no known allergies.  Home Medications   Current Outpatient Rx  Name  Route  Sig  Dispense  Refill  . HYDROcodone-acetaminophen (NORCO/VICODIN) 5-325 MG per tablet   Oral   Take 1-2 tablets by mouth every 6 (six) hours as needed for pain.   15 tablet   0    BP 126/86  Pulse 81  Temp(Src) 98.4 F (36.9 C) (Oral)  Resp 16  SpO2 100%  Filed Vitals:   10/13/12 1002 10/13/12 1212  BP: 126/86 124/89  Pulse: 81 70  Temp: 98.4 F (36.9 C) 98.3 F (36.8 C)  TempSrc: Oral Oral  Resp: 16 16  SpO2: 100% 100%     Physical Exam  Nursing note and vitals reviewed. Constitutional: He is oriented to person, place, and time. He appears well-developed and well-nourished. No distress.  HENT:  Head: Normocephalic and atraumatic.  Right Ear: External ear normal.  Left Ear: External ear normal.  Nose: Nose normal.  Mouth/Throat: Oropharynx is clear and moist.  Eyes: Conjunctivae are normal. Pupils are equal, round, and reactive to light. Right eye exhibits no discharge. Left eye exhibits no discharge.  Neck: Normal range of motion. Neck supple.  No cervical spinal or paraspinal tenderness  Cardiovascular: Normal rate, regular rhythm, normal heart sounds and intact distal pulses.  Exam reveals no gallop and no friction rub.   No murmur heard. Radial and dorsalis pedis pulses present bilaterally  Pulmonary/Chest: Effort normal and breath sounds normal. No respiratory distress. He has no wheezes. He has no rales. He exhibits no tenderness.  Abdominal: Soft. Bowel sounds are normal. He exhibits no distension. There is no tenderness.  Musculoskeletal: Normal range of motion. He exhibits tenderness. He exhibits no  edema.  Diffuse left lower lumbar paraspinal tenderness to palpation.  No thoracic or lumbar spinal tenderness.  No difficulty with ambulation. Strength 5/5 in the lower extremities.    Neurological: He is alert and oriented to person, place, and time.  No focal neurological deficits. Sensation intact. Patellar reflexes intact.   Skin: Skin is warm and dry. He is not diaphoretic.  No wounds, ecchymosis, edema, or erythema to the back throughout    ED Course  Procedures (including critical care time) Labs Review Labs Reviewed - No data to display Imaging Review No results found.  MDM   1. Back pain     Edwin Cline is a 37 y.o. male with a PMH of back surgery who presents to the ED for evaluation of back pain.  History was provided by the patient.  Toradol ordered for pain.  Decadron IM ordered.     Rechecks  11:51 AM = patient refused Toradol stating that there are side effects that "I don't want."  He states that he looked up the side effects of Toradol on his tablet which include heart attack. He states he did not take this risk. He states the only thing that works and that he is comfortable taking is Percocet. He states he's been trying Tylenol and ibuprofen at home however this is also not improved his pain. I discussed outpatient pain management with him at length. He states that he has tried Flexeril in the past and it has not worked for him.  He states he has an appointment with a provider next week to get disability due to his back pain.  He just needs a few until then.      Etiology of back pain is likely chronic in nature.  Possibly muscular strain.  Patient was neurovascularly intact.  He remained in no acute distress.  Patient was prescribed a few percocet for outpatient management.  He was instructed to follow-up with his surgeon as soon as possible for further evaluation and management.  He was educated on the use to the ED for pain management.  Patient was instructed to  return to the ED if they experience any loss of bowel/bladder function, weakness, or other concerns.  Patient was in agreement with discharge and plan.  Patient obtaining a ride home from the ED.     Final impressions: 1. Back pain     Greer Ee Stanley Helmuth PA-C        Jillyn Ledger, [place] 10/14/12 1725

## 2012-10-13 NOTE — ED Notes (Signed)
Pt c/o chronic back pain.  Pain score 10/10.  Sts increased activity x 2 months and sts "I've been out of my medicine "forever."  Hx of back surgery in 2012.

## 2012-10-13 NOTE — Progress Notes (Signed)
[facility] provided pt with a [facility] [employer] Card application.  °

## 2012-10-15 NOTE — ED Provider Notes (Signed)
Medical screening examination/treatment/procedure(s) were performed by non-physician practitioner and as supervising physician I was immediately available for consultation/collaboration.  Denson Niccoli M Giulia Hickey, MD 10/15/12 1510 

## 2012-12-25 ENCOUNTER — Emergency Department (HOSPITAL_COMMUNITY)
Admission: EM | Admit: 2012-12-25 | Discharge: 2012-12-25 | Disposition: A | Discharge status: Home / Self Care | Payer: Self-pay | Attending: Emergency Medicine | Admitting: Emergency Medicine

## 2012-12-25 ENCOUNTER — Emergency Department (HOSPITAL_COMMUNITY): Payer: Self-pay

## 2012-12-25 ENCOUNTER — Encounter (HOSPITAL_COMMUNITY): Payer: Self-pay | Admitting: Emergency Medicine

## 2012-12-25 DIAGNOSIS — X500XXA Overexertion from strenuous movement or load, initial encounter: Secondary | ICD-10-CM | POA: Insufficient documentation

## 2012-12-25 DIAGNOSIS — S93409A Sprain of unspecified ligament of unspecified ankle, initial encounter: Secondary | ICD-10-CM | POA: Insufficient documentation

## 2012-12-25 DIAGNOSIS — Y9367 Activity, basketball: Secondary | ICD-10-CM | POA: Insufficient documentation

## 2012-12-25 DIAGNOSIS — Y9239 Other specified sports and athletic area as the place of occurrence of the external cause: Secondary | ICD-10-CM | POA: Insufficient documentation

## 2012-12-25 MED ORDER — HYDROCODONE-ACETAMINOPHEN 5-325 MG PO TABS: 1.0000 | ORAL_TABLET | ORAL | Status: DC | PRN

## 2012-12-25 NOTE — ED Notes (Signed)
Ortho tech at bedside 

## 2012-12-25 NOTE — ED Provider Notes (Signed)
Medical screening examination/treatment/procedure(s) were performed by non-physician practitioner and as supervising physician I was immediately available for consultation/collaboration.  EKG Interpretation   None         Enid Skeens, MD 12/25/12 1622

## 2012-12-25 NOTE — Progress Notes (Signed)
Orthopedic Tech Progress Note Patient Details:  Edwin Cline March 03, 1975 [id number] ASO applied. Crutches fit for height and comfort. Ortho Devices Type of Ortho Device: ASO;Crutches Ortho Device/Splint Location: Left LE Ortho Device/Splint Interventions: Application   Asia R Thompson 12/25/2012, 12:38 PM

## 2012-12-25 NOTE — ED Provider Notes (Signed)
CSN: [id number]     Arrival date & time 12/25/12  1058 History  This chart was scribed for non-physician practitioner, Marlon Pel, PA-C working with Enid Skeens, MD by Luisa Dago, ED scribe. This patient was seen in room TR10C/TR10C and the patient's care was started at 12:08 PM.     Chief Complaint  Patient presents with  . Ankle Pain    The history is provided by the patient. No language interpreter was used.   HPI Comments: Edwin Cline is a 37 y.o. male who presents to the Emergency Department complaining of sudden onset left ankle pain with associated swelling that started 2 days ago. Pt was playing basketball when he jumped and landed on his foot wrong. Pt has been taking OTC pain medication with little to no relief. Bearing weight and certain movement worsens the pain. Denies head injury.   History reviewed. No pertinent past medical history. Past Surgical History  Procedure Laterality Date  . Back surgery     Family History  Problem Relation Age of Onset  . Diabetes Father    History  Substance Use Topics  . Smoking status: Never Smoker   . Smokeless tobacco: Not on file  . Alcohol Use: No    Review of Systems  Musculoskeletal: Positive for arthralgias and joint swelling.  All other systems reviewed and are negative.    Allergies  Review of patient's allergies indicates no known allergies.  Home Medications   Current Outpatient Rx  Name  Route  Sig  Dispense  Refill  . HYDROcodone-acetaminophen (NORCO/VICODIN) 5-325 MG per tablet   Oral   Take 1-2 tablets by mouth every 4 (four) hours as needed.   10 tablet   0    BP 132/81  Pulse 75  Temp(Src) 97.8 F (36.6 C) (Oral)  Resp 15  Ht 6\' 3"  (1.905 m)  Wt 195 lb (88.451 kg)  BMI 24.37 kg/m2  SpO2 100% Physical Exam  Nursing note and vitals reviewed. Constitutional: He is oriented to person, place, and time. He appears well-developed and well-nourished. No distress.  HENT:  Head:  Normocephalic and atraumatic.  Eyes: EOM are normal.  Neck: Neck supple. No tracheal deviation present.  Cardiovascular: Normal rate.   Strong pedal pulse. Physiologic capillary refill  Pulmonary/Chest: Effort normal. No respiratory distress.  Musculoskeletal: Normal range of motion.  Decreased range of motion due to pain. Swelling and tenderness to the left lateral malleolus  Neurological: He is alert and oriented to person, place, and time.  Skin: Skin is warm and dry.  Psychiatric: He has a normal mood and affect. His behavior is normal.    ED Course  Procedures (including critical care time)  DIAGNOSTIC STUDIES: Oxygen Saturation is 100% on room air, normal by my interpretation.    COORDINATION OF CARE: 12:11 PM-Discussed treatment plan which includes rest/ice/elevate, crutches, ASO wrap, and pain medication with pt at bedside and pt agreed to plan.   Labs Review Labs Reviewed - No data to display Imaging Review Dg Ankle Complete Left  12/25/2012   CLINICAL DATA:  Status post fall with lateral ankle pain  EXAM: LEFT ANKLE COMPLETE - 3+ VIEW  COMPARISON:  May 24, 2007  FINDINGS: There is no evidence of fracture, dislocation, or joint effusion. There is no evidence of arthropathy or other focal bone abnormality. Soft tissues are unremarkable.  IMPRESSION: Negative.   Electronically Signed   By: Sherian Rein M.D.   On: 12/25/2012 11:56    EKG Interpretation  None       MDM   1. Ankle sprain and strain, left, initial encounter     Referral to Ortho  37 y.o.Edwin Cline's evaluation in the Emergency Department is complete. It has been determined that no acute conditions requiring further emergency intervention are present at this time. The patient/guardian have been advised of the diagnosis and plan. We have discussed signs and symptoms that warrant return to the ED, such as changes or worsening in symptoms.  Vital signs are stable at discharge. Filed Vitals:    12/25/12 1106  BP: 132/81  Pulse: 75  Temp: 97.8 F (36.6 C)  Resp: 15    Patient/guardian has voiced understanding and agreed to follow-up with the PCP or specialist.  I personally performed the services described in this documentation, which was scribed in my presence. The recorded information has been reviewed and is accurate.   Dorthula Matas, PA-C 12/25/12 1308

## 2012-12-25 NOTE — ED Notes (Signed)
Pt c/o L ankle pain since playing basketball on Tuesday states he "Twisted it when i landed wrong." sensation intact, can wiggle toes. Describes as "constant dull ache but sharp when i walk."

## 2013-03-28 ENCOUNTER — Encounter (HOSPITAL_COMMUNITY): Payer: Self-pay | Admitting: Emergency Medicine

## 2013-03-28 ENCOUNTER — Emergency Department (HOSPITAL_COMMUNITY)
Admission: EM | Admit: 2013-03-28 | Discharge: 2013-03-28 | Disposition: A | Discharge status: Home / Self Care | Payer: 59 | Attending: Emergency Medicine | Admitting: Emergency Medicine

## 2013-03-28 DIAGNOSIS — Z791 Long term (current) use of non-steroidal anti-inflammatories (NSAID): Secondary | ICD-10-CM | POA: Insufficient documentation

## 2013-03-28 DIAGNOSIS — J029 Acute pharyngitis, unspecified: Secondary | ICD-10-CM | POA: Insufficient documentation

## 2013-03-28 LAB — RAPID STREP SCREEN (MED CTR MEBANE ONLY): STREPTOCOCCUS, GROUP A SCREEN (DIRECT): NEGATIVE

## 2013-03-28 MED ORDER — LIDOCAINE VISCOUS 2 % MT SOLN
15.0000 mL | Freq: Once | OROMUCOSAL | Status: AC
  Administered 2013-03-28: 15 mL via OROMUCOSAL
  Filled 2013-03-28: qty 15

## 2013-03-28 MED ORDER — IBUPROFEN 800 MG PO TABS: 800.0000 mg | ORAL_TABLET | Freq: Three times a day (TID) | ORAL | Status: DC

## 2013-03-28 MED ORDER — HYDROCODONE-HOMATROPINE 5-1.5 MG/5ML PO SYRP: 5.0000 mL | ORAL_SOLUTION | Freq: Four times a day (QID) | ORAL | Status: DC | PRN

## 2013-03-28 NOTE — ED Notes (Signed)
Pt. reports sore throat / swelling with occasional productive cough onset last night , airway intact /respirations unlabored . Denies fever or chills.

## 2013-03-28 NOTE — ED Provider Notes (Signed)
CSN: [id number]     Arrival date & time 03/28/13  2019 History   This chart was scribed for non-physician practitioner Victorino DikeJennifer L. Leann Mayweather, PA-C, working with Raeford RazorStephen Kohut, MD, by Yevette EdwardsAngela Bracken, ED Scribe. This patient was seen in room TR04C/TR04C and the patient's care was started at 8:42 PM.   First MD Initiated Contact with Patient 03/28/13 2042     Chief Complaint  Patient presents with  . Sore Throat    The history is provided by the patient. No language interpreter was used.   HPI Comments: Edwin Cline is a 38 y.o. male who presents to the Emergency Department complaining of a sore throat which began yesterday and has been associated with a cough productive of phlegm flecked with blood. He denies coughing up significant amounts of blood. He also denies congestion, rhinorrhea, or a fever.  The pt has used gargling rinse without resolution. He denies a h/o similar symptoms. He also denies sick contacts.   History reviewed. No pertinent past medical history. Past Surgical History  Procedure Laterality Date  . Back surgery     Family History  Problem Relation Age of Onset  . Diabetes Father    History  Substance Use Topics  . Smoking status: Never Smoker   . Smokeless tobacco: Not on file  . Alcohol Use: No    Review of Systems  Constitutional: Negative for fever.  HENT: Positive for postnasal drip and sore throat. Negative for congestion and rhinorrhea.   Respiratory: Positive for cough.   All other systems reviewed and are negative.    Allergies  Review of patient's allergies indicates no known allergies.  Home Medications   Current Outpatient Rx  Name  Route  Sig  Dispense  Refill  . HYDROcodone-homatropine (HYCODAN) 5-1.5 MG/5ML syrup   Oral   Take 5 mLs by mouth every 6 (six) hours as needed for cough.   120 mL   0   . ibuprofen (ADVIL,MOTRIN) 800 MG tablet   Oral   Take 1 tablet (800 mg total) by mouth 3 (three) times daily.   21 tablet   0     Triage Vitals: BP 119/82  Pulse 82  Temp(Src) 97.8 F (36.6 C) (Oral)  Resp 14  Ht 6\' 3"  (1.905 m)  Wt 199 lb (90.266 kg)  BMI 24.87 kg/m2  SpO2 99%  Physical Exam  Constitutional: He is oriented to person, place, and time. He appears well-developed and well-nourished. No distress.  HENT:  Head: Normocephalic and atraumatic.  Right Ear: Hearing, tympanic membrane, external ear and ear canal normal.  Left Ear: Hearing, tympanic membrane, external ear and ear canal normal.  Nose: Nose normal.  Mouth/Throat: Uvula is midline and mucous membranes are normal. No trismus in the jaw. No uvula swelling. Posterior oropharyngeal erythema present. No oropharyngeal exudate, posterior oropharyngeal edema or tonsillar abscesses.  Eyes: Conjunctivae are normal.  Neck: Normal range of motion. Neck supple.  Cardiovascular: Normal rate, regular rhythm and normal heart sounds.   Pulmonary/Chest: Effort normal and breath sounds normal. No respiratory distress.  Abdominal: Soft.  Musculoskeletal: Normal range of motion.  Lymphadenopathy:    He has cervical adenopathy.  Neurological: He is alert and oriented to person, place, and time.  Skin: Skin is warm and dry. He is not diaphoretic.  Psychiatric: He has a normal mood and affect.    ED Course  Procedures (including critical care time) Medications  lidocaine (XYLOCAINE) 2 % viscous mouth solution 15 mL (15 mLs Mouth/Throat  Given 03/28/13 2220)     DIAGNOSTIC STUDIES: Oxygen Saturation is 99% on room air, normal by my interpretation.    COORDINATION OF CARE:  8:54 PM- Discussed treatment plan with patient, which includes a strep culture, and the patient agreed to the plan.   Labs Review Labs Reviewed  RAPID STREP SCREEN  CULTURE, GROUP A STREP   Imaging Review No results found.   EKG Interpretation None      MDM   Final diagnoses:  Viral pharyngitis   Filed Vitals:   03/28/13 2310  BP: 132/97  Pulse: 76  Temp:    Resp: 20    Afebrile, NAD, non-toxic appearing, AAOx4. Pt afebrile without tonsillar exudate, negative strep. Presents with mild cervical lymphadenopathy, & dysphagia; diagnosis of viral pharyngitis. No abx indicated. DC w symptomatic tx for pain  Pt does not appear dehydrated, but did discuss importance of water rehydration. Presentation non concerning for PTA or infxn spread to soft tissue. No trismus or uvula deviation. Specific return precautions discussed. Pt able to drink water in ED without difficulty with intact air way. Recommended PCP follow up. Patient is stable at time of discharge     I personally performed the services described in this documentation, which was scribed in my presence. The recorded information has been reviewed and is accurate.     Jeannetta Ellis, PA-C 03/28/13 2343

## 2013-03-28 NOTE — Discharge Instructions (Signed)
Your rapid strep test was negative for a bacterial infection. Please take Hycodan as prescribed for throat pain please be careful while taking this medication as it does contain a narcotic. Please take Motrin as prescribed. Please read all discharge instructions and return precautions.   Viral Pharyngitis Viral pharyngitis is a viral infection that produces redness, pain, and swelling (inflammation) of the throat. It can spread from person to person (contagious). CAUSES Viral pharyngitis is caused by inhaling a large amount of certain germs called viruses. Many different viruses cause viral pharyngitis. SYMPTOMS Symptoms of viral pharyngitis include:  Sore throat.  Tiredness.  Stuffy nose.  Low-grade fever.  Congestion.  Cough. TREATMENT Treatment includes rest, drinking plenty of fluids, and the use of over-the-counter medication (approved by your caregiver). HOME CARE INSTRUCTIONS   Drink enough fluids to keep your urine clear or pale yellow.  Eat soft, cold foods such as ice cream, frozen ice pops, or gelatin dessert.  Gargle with warm salt water (1 tsp salt per 1 qt of water).  If over age 217, throat lozenges may be used safely.  Only take over-the-counter or prescription medicines for pain, discomfort, or fever as directed by your caregiver. Do not take aspirin. To help prevent spreading viral pharyngitis to others, avoid:  Mouth-to-mouth contact with others.  Sharing utensils for eating and drinking.  Coughing around others. SEEK MEDICAL CARE IF:   You are better in a few days, then become worse.  You have a fever or pain not helped by pain medicines.  There are any other changes that concern you. Document Released: 10/11/2004 Document Revised: 03/26/2011 Document Reviewed: 03/09/2010 [facility] Patient Information 2014 [employer] [place].

## 2013-03-28 NOTE — ED Notes (Signed)
Onset 2 days sore throat.  Painful to swallow.  No difficulties swallowing or breathing.  No other s/s noted.

## 2013-03-30 LAB — CULTURE, GROUP A STREP

## 2013-03-31 NOTE — ED Provider Notes (Signed)
Medical screening examination/treatment/procedure(s) were performed by non-physician practitioner and as supervising physician I was immediately available for consultation/collaboration.   EKG Interpretation None       Raeford RazorStephen Royale Lennartz, MD 03/31/13 2255

## 2013-05-04 ENCOUNTER — Ambulatory Visit (INDEPENDENT_AMBULATORY_CARE_PROVIDER_SITE_OTHER): Payer: 59 | Admitting: Family Medicine

## 2013-05-04 VITALS — BP 120/80 | HR 68 | Temp 98.2°F | Resp 17 | Ht 73.0 in | Wt 201.0 lb

## 2013-05-04 DIAGNOSIS — M549 Dorsalgia, unspecified: Secondary | ICD-10-CM

## 2013-05-04 MED ORDER — CYCLOBENZAPRINE HCL 10 MG PO TABS: 10.0000 mg | ORAL_TABLET | Freq: Three times a day (TID) | ORAL | Status: DC | PRN

## 2013-05-04 MED ORDER — NAPROXEN SODIUM 550 MG PO TABS: 550.0000 mg | ORAL_TABLET | Freq: Two times a day (BID) | ORAL | Status: DC

## 2013-05-04 NOTE — Progress Notes (Signed)
Subjective:    Patient ID: Edwin Cline, male    DOB: 11-Jun-1975, 38 y.o.   MRN: [id number]  HPI Patient presents today with chronic lower back pain. This especially bothers him if he bends down or picks up anything over 20 pounds. This has been an off and on problem since 2011. Had surgery and rehab in 2012. Had some improvement following surgery and rehab, but has had problems since resuming all normal activities. 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 Standing for prolonged time bothers his back as does playing with his children who are 7 and 12.Has difficulty after sitting for a long time. Patient has not regularly done post surgery exercises.  Has taken Flexeril and Robaxin in past with some relief. In order to control pain, prefers to use muscle relaxer and percocet. Does not take antiinflammatory on a daily basis. He was without insurance after his surgery and relied on ER visits for short term percocet prescriptions. Does not receive regular medical care.   PMH- patient denies PSH- spine surgery 2012. FH- Married with 2 children SH- denies tobacco, ETOH or illicit drugs  Review of Systems Occasionally has tingling and cramping in left leg, no falls, no problems with bowels or bladder.     Objective:   Physical Exam  Vitals reviewed. Constitutional: He is oriented to person, place, and time. He appears well-developed and well-nourished.  HENT:  Head: Normocephalic and atraumatic.  Right Ear: External ear normal.  Left Ear: External ear normal.  Mouth/Throat: Oropharynx is clear and moist.  Eyes: Conjunctivae are normal. Pupils are equal, round, and reactive to light. Right eye exhibits no discharge. Left eye exhibits no discharge.  Neck: Normal range of motion. Neck supple.  Cardiovascular: Normal rate, regular rhythm, normal heart sounds and intact distal pulses.   Pulmonary/Chest: Effort  normal and breath sounds normal.  Abdominal: Soft. Bowel sounds are normal.  Musculoskeletal: He exhibits tenderness. He exhibits no edema.  Patient with decreased spinal flexion and extension, otherwise ROM normal. Strength good throughout. Left flank area with more muscle development, muscle feels tight and is tender to palpation. Lumbar spine with some tenderness, well healed scar noted. Left calf smaller than right.  Lymphadenopathy:    He has no cervical adenopathy.  Neurological: He is alert and oriented to person, place, and time. He has normal strength.  Slightly decreased patellar reflexes.  Skin: Skin is warm and dry.  Psychiatric: He has a normal mood and affect. His behavior is normal. Judgment and thought content normal.       Assessment & Plan:  1. Back pain - naproxen sodium (ANAPROX DS) 550 MG tablet; Take 1 tablet (550 mg total) by mouth 2 (two) times daily with a meal.  Dispense: 30 tablet; Refill: 2 - cyclobenzaprine (FLEXERIL) 10 MG tablet; Take 1 tablet (10 mg total) by mouth 3 (three) times daily as needed for muscle spasms.  Dispense: 30 tablet; Refill: 2 Patient to try to take qhs prn. - Ambulatory referral to Physical Therapy -Discussed chronic nature of his back pain and risks associated with chronic narcotic pain medicine. Will optimize NSAID/muscle relaxer and improve strength/flexibility with PT. -Provided Back Care materials and discussed proper body mechanics, stretching and strengthening. -Encouraged patient to make an appointment in the appointment center for primary care/continuing management of chronic back pain. -If no improvement, can consider pain clinic referral.  Gavin Poundeborah  Shea StakesB. Gessner, FNP-BC  [facility], [facility]  05/04/2013 1:46 PM

## 2013-05-18 ENCOUNTER — Ambulatory Visit: Payer: 59 | Attending: Family Medicine

## 2013-12-20 ENCOUNTER — Emergency Department (INDEPENDENT_AMBULATORY_CARE_PROVIDER_SITE_OTHER): Payer: 59

## 2013-12-20 ENCOUNTER — Emergency Department (INDEPENDENT_AMBULATORY_CARE_PROVIDER_SITE_OTHER)
Admission: EM | Admit: 2013-12-20 | Discharge: 2013-12-20 | Disposition: A | Discharge status: Home / Self Care | Payer: Self-pay | Source: Home / Self Care | Attending: Family Medicine | Admitting: Family Medicine

## 2013-12-20 ENCOUNTER — Encounter (HOSPITAL_COMMUNITY): Payer: Self-pay | Admitting: *Deleted

## 2013-12-20 DIAGNOSIS — M79646 Pain in unspecified finger(s): Secondary | ICD-10-CM

## 2013-12-20 DIAGNOSIS — M25532 Pain in left wrist: Secondary | ICD-10-CM

## 2013-12-20 DIAGNOSIS — M25549 Pain in joints of unspecified hand: Secondary | ICD-10-CM

## 2013-12-20 MED ORDER — HYDROCODONE-ACETAMINOPHEN 5-325 MG PO TABS: 1.0000 | ORAL_TABLET | Freq: Four times a day (QID) | ORAL | Status: DC | PRN

## 2013-12-20 MED ORDER — PREDNISONE 10 MG PO KIT: PACK | ORAL | Status: DC

## 2013-12-20 NOTE — Discharge Instructions (Signed)
Thank you for coming in today. Do not drive after taking norco.   Carpal Tunnel Syndrome The carpal tunnel is a narrow area located on the palm side of your wrist. The tunnel is formed by the wrist bones and ligaments. Nerves, blood vessels, and tendons pass through the carpal tunnel. Repeated wrist motion or certain diseases may cause swelling within the tunnel. This swelling pinches the main nerve in the wrist (median nerve) and causes the painful hand and arm condition called carpal tunnel syndrome. CAUSES   Repeated wrist motions.  Wrist injuries.  Certain diseases like arthritis, diabetes, alcoholism, hyperthyroidism, and kidney failure.  Obesity.  Pregnancy. SYMPTOMS   A "pins and needles" feeling in your fingers or hand, especially in your thumb, index and middle fingers.  Tingling or numbness in your fingers or hand.  An aching feeling in your entire arm, especially when your wrist and elbow are bent for long periods of time.  Wrist pain that goes up your arm to your shoulder.  Pain that goes down into your palm or fingers.  A weak feeling in your hands. DIAGNOSIS  Your health care provider will take your history and perform a physical exam. An electromyography test may be needed. This test measures electrical signals sent out by your nerves into the muscles. The electrical signals are usually slowed by carpal tunnel syndrome. You may also need X-rays. TREATMENT  Carpal tunnel syndrome may clear up by itself. Your health care provider may recommend a wrist splint or medicine such as a nonsteroidal anti-inflammatory medicine. Cortisone injections may help. Sometimes, surgery may be needed to free the pinched nerve.  HOME CARE INSTRUCTIONS   Take all medicine as directed by your health care provider. Only take over-the-counter or prescription medicines for pain, discomfort, or fever as directed by your health care provider.  If you were given a splint to keep your wrist  from bending, wear it as directed. It is important to wear the splint at night. Wear the splint for as long as you have pain or numbness in your hand, arm, or wrist. This may take 1 to 2 months.  Rest your wrist from any activity that may be causing your pain. If your symptoms are work-related, you may need to talk to your employer about changing to a job that does not require using your wrist.  Put ice on your wrist after long periods of wrist activity.  Put ice in a plastic bag.  Place a towel between your skin and the bag.  Leave the ice on for 15-20 minutes, 03-04 times a day.  Keep all follow-up visits as directed by your health care provider. This includes any orthopedic referrals, physical therapy, and rehabilitation. Any delay in getting necessary care could result in a delay or failure of your condition to heal. SEEK IMMEDIATE MEDICAL CARE IF:   You have new, unexplained symptoms.  Your symptoms get worse and are not helped or controlled with medicines. MAKE SURE YOU:   Understand these instructions.  Will watch your condition.  Will get help right away if you are not doing well or get worse. Document Released: 12/30/1999 Document Revised: 05/18/2013 Document Reviewed: 11/17/2010 [facility] Patient Information 2015 [place], [place]. This information is not intended to replace advice given to you by your health care provider. Make sure you discuss any questions you have with your health care provider.

## 2013-12-20 NOTE — ED Provider Notes (Signed)
Edwin Cline is a 38 y.o. male who presents to Urgent Care today for left hand pain. Patient has pain in the left thumb present for about one month. The pain started after he started working in a [employer]. The pain is worse with activity and better with rest. The pain sometimes wakes him at night from sleep. He has tried a Ace wrap and some tramadol which has not helped much. No fevers or chills vomiting or diarrhea. No injury.   History reviewed. No pertinent past medical history. Past Surgical History  Procedure Laterality Date  . Back surgery    . Spine surgery     History  Substance Use Topics  . Smoking status: Never Smoker   . Smokeless tobacco: Not on file  . Alcohol Use: Yes     Comment: occasional   ROS as above Medications: No current facility-administered medications for this encounter.   Current Outpatient Prescriptions  Medication Sig Dispense Refill  . HYDROcodone-acetaminophen (NORCO/VICODIN) 5-325 MG per tablet Take 1 tablet by mouth every 6 (six) hours as needed. 15 tablet 0  . PredniSONE 10 MG KIT 12 day dose pack po 1 kit 0   No Known Allergies   Exam:  BP 115/59 mmHg  Pulse 59  Temp(Src) 98.5 F (36.9 C) (Oral)  Resp 12  SpO2 100% Gen: Well NAD Left hand: Normal-appearing. No thenar atrophy.  Positive Tinel's test at the wrist. Tender at the [facility] and thenar eminence. Motion intact. Sensation intact. Strength is diminished. Pulses and capillary refill are intact. Positive Phalen's test as well. Negative Finkelstein's test.  No results found for this or any previous visit (from the past 24 hour(s)). Dg Wrist Complete Left  12/20/2013   CLINICAL DATA:  Left wrist pain for 1 month.  EXAM: LEFT WRIST - COMPLETE 3+ VIEW  COMPARISON:  None.  FINDINGS: There is no evidence of fracture or dislocation. There is no evidence of arthropathy or other focal bone abnormality. Soft tissues are unremarkable.  IMPRESSION: Negative.   Electronically Signed   By: Maryclare Bean M.D.   On: 12/20/2013 12:38   Dg Hand Complete Left  12/20/2013   CLINICAL DATA:  Left thumb pain for 1 month, no single trauma episode  EXAM: LEFT HAND - COMPLETE 3+ VIEW  COMPARISON:  None.  FINDINGS: There is no evidence of fracture or dislocation. There is no evidence of arthropathy or other focal bone abnormality. Soft tissues are unremarkable.  IMPRESSION: Negative.   Electronically Signed   By: Conchita Paris M.D.   On: 12/20/2013 12:38    Assessment and Plan: 38 y.o. male with hand and wrist pain. Suspect overuse injury. Carpal tunnel syndrome is also significant possibility. Patient was placed into a thumb spica splint and given a course of prednisone with Norco to control pain. Handout provided. Follow up with sports medicine.  Discussed warning signs or symptoms. Please see discharge instructions. Patient expresses understanding.     Gregor Hams, MD 12/20/13 [id number]

## 2013-12-20 NOTE — ED Notes (Signed)
C/O gradual onset left hand pain x 1 month with progressive worsening.  Has been using Ace wrap and taking tramadol.

## 2013-12-24 ENCOUNTER — Encounter (HOSPITAL_COMMUNITY): Payer: Self-pay | Admitting: *Deleted

## 2013-12-24 ENCOUNTER — Emergency Department (INDEPENDENT_AMBULATORY_CARE_PROVIDER_SITE_OTHER)
Admission: EM | Admit: 2013-12-24 | Discharge: 2013-12-24 | Disposition: A | Discharge status: Home / Self Care | Payer: Self-pay | Source: Home / Self Care | Attending: Family Medicine | Admitting: Family Medicine

## 2013-12-24 DIAGNOSIS — M79642 Pain in left hand: Secondary | ICD-10-CM

## 2013-12-24 MED ORDER — AMITRIPTYLINE HCL 25 MG PO TABS: 25.0000 mg | ORAL_TABLET | Freq: Every evening | ORAL | Status: DC | PRN

## 2013-12-24 MED ORDER — HYDROCODONE-ACETAMINOPHEN 5-325 MG PO TABS: 1.0000 | ORAL_TABLET | Freq: Four times a day (QID) | ORAL | Status: DC | PRN

## 2013-12-24 NOTE — Discharge Instructions (Signed)
Thank you for coming in today. Continue the brace as needed.  Take norco for severe pain.  Use amitriptyline at nighttime as this may help with pain as well Follow-up at the sports medicine Center soon  Go to the ER if you get worse

## 2013-12-24 NOTE — ED Notes (Signed)
Pt    Is  Here  For  A  Recheck  Of  His  l  Hand      He  Is  Wearing  A  Thumb  Spica  Brace       -  He  Got  His  meds    Filled      -  And     He  States  Huis  Hand  Is    Not  Much   Better

## 2013-12-24 NOTE — ED Provider Notes (Signed)
Edwin Cline is a 38 y.o. male who presents to Urgent Care today for left hand pain. Patient was seen on the sixth for the same complaint. Patient noted pain at the [facility] joint of the thumb on the left hand following working at a [employer]. He was thought to have tendinitis versus carpal tunnel syndrome. He was given a thumb spica splint prednisone and Norco. His pain persists. He denies any sick pain radiating from his neck. He notes mild elbow pain as well. No weakness or numbness. He is unable to work because of pain.   History reviewed. No pertinent past medical history. Past Surgical History  Procedure Laterality Date  . Back surgery    . Spine surgery     History  Substance Use Topics  . Smoking status: Never Smoker   . Smokeless tobacco: Not on file  . Alcohol Use: Yes     Comment: occasional   ROS as above Medications: No current facility-administered medications for this encounter.   Current Outpatient Prescriptions  Medication Sig Dispense Refill  . amitriptyline (ELAVIL) 25 MG tablet Take 1 tablet (25 mg total) by mouth at bedtime as needed (pain). 30 tablet 0  . HYDROcodone-acetaminophen (NORCO/VICODIN) 5-325 MG per tablet Take 1 tablet by mouth every 6 (six) hours as needed. 30 tablet 0  . PredniSONE 10 MG KIT 12 day dose pack po 1 kit 0   No Known Allergies   Exam:  BP 130/83 mmHg  Pulse 61  Temp(Src) 98.1 F (36.7 C) (Oral)  Resp 14  SpO2 100% Gen: Well NAD Left hand is normal-appearing. Pulses are intact at the wrist. Capillary refill and sensation are intact distally. Grip strength is intact. Tender at the volar [facility]. Mildly tender with palpation near the Lateral epicondyle. No skin erythema or abscess visible.  No results found for this or any previous visit (from the past 24 hour(s)). No results found.  Assessment and Plan: 38 y.o. male with left wrist pain. Tendinitis versus carpal tunnel syndrome versus radial tunnel syndrome. Plan to continue  prednisone and thumb spica splint. Increase Norco for pain control. Add amitriptyline. Encourage patient follow-up with sports medicine for further evaluation and management of this issue.  Discussed warning signs or symptoms. Please see discharge instructions. Patient expresses understanding.     Gregor Hams, MD 12/24/13 [phone]

## 2014-04-15 ENCOUNTER — Encounter (HOSPITAL_COMMUNITY): Payer: Self-pay | Admitting: Emergency Medicine

## 2014-04-15 ENCOUNTER — Emergency Department (INDEPENDENT_AMBULATORY_CARE_PROVIDER_SITE_OTHER)
Admission: EM | Admit: 2014-04-15 | Discharge: 2014-04-15 | Disposition: A | Discharge status: Home / Self Care | Payer: Self-pay | Source: Home / Self Care | Attending: Family Medicine | Admitting: Family Medicine

## 2014-04-15 DIAGNOSIS — G5601 Carpal tunnel syndrome, right upper limb: Secondary | ICD-10-CM

## 2014-04-15 DIAGNOSIS — G5602 Carpal tunnel syndrome, left upper limb: Secondary | ICD-10-CM

## 2014-04-15 DIAGNOSIS — G5603 Carpal tunnel syndrome, bilateral upper limbs: Secondary | ICD-10-CM

## 2014-04-15 MED ORDER — AMITRIPTYLINE HCL 25 MG PO TABS: 25.0000 mg | ORAL_TABLET | Freq: Every evening | ORAL | Status: DC | PRN

## 2014-04-15 NOTE — ED Notes (Signed)
Reports bilateral hand pain with sharp shooting pains down arms.  Denies injury.  States pain in left hand has been present x 2 months.  Pain in right hand x 1 wk.  Having mild relief with percocet that he takes for his back pain.

## 2014-04-15 NOTE — ED Provider Notes (Signed)
Edwin Cline is a 39 y.o. male who presents to Urgent Care today for bilateral wrist pain. Patient notes tingling pain into the first 3 digits of his hands bilaterally. The left and has been worse than the right. This is been ongoing since December. He's been seen now 3 times at this location for this problem. He's used some amitriptyline which seemed to help however that has run out. He's had trials of prednisone. Additionally he is used a wrist splint which has worn out. He does not have health insurance yet; he has not followed up with hand surgery or sports medicine as directed. No fevers or chills nausea vomiting or diarrhea.   History reviewed. No pertinent past medical history. Past Surgical History  Procedure Laterality Date  . Back surgery    . Spine surgery     History  Substance Use Topics  . Smoking status: Never Smoker   . Smokeless tobacco: Not on file  . Alcohol Use: Yes     Comment: occasional   ROS as above Medications: No current facility-administered medications for this encounter.   Current Outpatient Prescriptions  Medication Sig Dispense Refill  . amitriptyline (ELAVIL) 25 MG tablet Take 1 tablet (25 mg total) by mouth at bedtime as needed (pain). 30 tablet 2  . HYDROcodone-acetaminophen (NORCO/VICODIN) 5-325 MG per tablet Take 1 tablet by mouth every 6 (six) hours as needed. 30 tablet 0  . PredniSONE 10 MG KIT 12 day dose pack po 1 kit 0   No Known Allergies   Exam:  BP 140/99 mmHg  Pulse 67  Temp(Src) 98.6 F (37 C) (Oral)  Resp 18  SpO2 100% Gen: Well NAD Hands bilaterally. Normal-appearing no significant thenar atrophy. Sensation intact distally. Pulses intact to wrist. Positive Tinel's and Phalen's test bilaterally.  No results found for this or any previous visit (from the past 24 hour(s)). No results found.  Assessment and Plan: 39 y.o. male with carpal tunnel syndrome. At this point has become chronic and patient is failing conservative  therapy. Patient declines trial of corticosteroid injection. We'll repeat a cock-up splint and continue amitriptyline. Follow-up with hand surgery. I frankly discussed with this patient that I cannot offer him any further help here. He must follow-up with surgery for this probably get better.  Discussed warning signs or symptoms. Please see discharge instructions. Patient expresses understanding.     Gregor Hams, MD 04/15/14 [phone]

## 2014-04-15 NOTE — Discharge Instructions (Signed)
Thank you for coming in today. You need to follow up with a hand surgeon. This problem will not get better. Take the amitriptyline at bed time as needed.  Use the wrist braces as needed.    Carpal Tunnel Syndrome Carpal tunnel syndrome is a disorder of the nervous system in the wrist that causes pain, hand weakness, and/or loss of feeling. Carpal tunnel syndrome is caused by the compression, stretching, or irritation of the median nerve at the wrist joint. Athletes who experience carpal tunnel syndrome may notice a decrease in their performance to the condition, especially for sports that require strong hand or wrist action.  SYMPTOMS   Tingling, numbness, or burning pain in the hand or fingers.  Inability to sleep due to pain in the hand.  Sharp pains that shoot from the wrist up the arm or to the fingers, especially at night.  Morning stiffness or cramping of the hand.  Thumb weakness, resulting in difficulty holding objects or making a fist.  Shiny, dry skin on the hand.  Reduced performance in any sport requiring a strong grip. CAUSES   Median nerve damage at the wrist is caused by pressure due to swelling, inflammation, or scarred tissue.  Sources of pressure include:  Repetitive gripping or squeezing that causes inflammation of the tendon sheaths.  Scarring or shortening of the ligament that covers the median nerve.  Traumatic injury to the wrist or forearm such as fracture, sprain, or dislocation.  Prolonged hyperextension (wrist bent backward) or hyperflexion (wrist bent downward) of the wrist. RISK INCREASES WITH:  Diabetes mellitus.  Menopause or amenorrhea.  Rheumatoid arthritis.  Raynaud disease.  Pregnancy.  Gout.  Kidney disease.  Ganglion cyst.  Repetitive hand or wrist action.  Hypothyroidism (underactive thyroid gland).  Repetitive jolting or shaking of the hands or wrist.  Prolonged forceful weight-bearing on the  hands. PREVENTION  Bracing the hand and wrist straight during activities that involve repetitive grasping.  For activities that require prolonged extension of the wrist (bending towards the top of the forearm) periodically change the position of your wrists.  Learn and use proper technique in activities that result in the wrist position in neutral to slight extension.  Avoid bending the wrist into full extension or flexion (up or down).  Keep the wrist in a straight (neutral) position. To keep the wrist in this position, wear a splint.  Avoid repetitive hand and wrist motions.  When possible avoid prolonged grasping of items (steering wheel of a car, a pen, a vacuum cleaner, or a rake).  Loosen your grip for activities that require prolonged grasping of items.  Place keyboards and writing surfaces at the correct height as to decrease strain on the wrist and hand.  Alternate work tasks to avoid prolonged wrist flexion.  Avoid pinching activities (needlework and writing) as they may irritate your carpal tunnel syndrome.  If these activities are necessary, complete them for shorter periods of time.  When writing, use a felt tip or rollerball pen and/or build up the grip on a pen to decrease the forces required for writing. PROGNOSIS  Carpal tunnel syndrome is usually curable with appropriate conservative treatment and sometimes resolves spontaneously. For some cases, surgery is necessary, especially if muscle wasting or nerve changes have developed.  RELATED COMPLICATIONS   Permanent numbness and a weak thumb or fingers in the affected hand.  Permanent paralysis of a portion of the hand and fingers. TREATMENT  Treatment initially consists of stopping activities that aggravate the  symptoms as well as medication and ice to reduce inflammation. A wrist splint is often recommended for wear during activities of repetitive motion as well as at night. It is also important to learn and use  proper technique when performing activities that typically cause pain. On occasion, a corticosteroid injection may be given. If symptoms persist despite conservative treatment, surgery may be an option. Surgical techniques free the pinched or compressed nerve. Carpal tunnel surgery is usually performed on an outpatient basis, meaning you go home the same day as surgery. These procedures provide almost complete relief of all symptoms in 95% of patients. Expect at least 2 weeks for healing after surgery. For cases that are the result of repeated jolting or shaking of the hand or wrist or prolonged hyperextension, surgery is not usually recommended because stretching of the median nerve, not compression, is usually the cause of carpal tunnel syndrome in these cases. MEDICATION   If pain medication is necessary, nonsteroidal anti-inflammatory medications, such as aspirin and ibuprofen, or other minor pain relievers, such as acetaminophen, are often recommended.  Do not take pain medication for 7 days before surgery.  Prescription pain relievers are usually only prescribed after surgery. Use only as directed and only as much as you need.  Corticosteroid injections may be given to reduce inflammation. However, they are not always recommended.  Vitamin B6 (pyridoxine) may reduce symptoms; use only if prescribed for your disorder. SEEK MEDICAL CARE IF:   Symptoms get worse or do not improve in 2 weeks despite treatment.  You also have a current or recent history of neck or shoulder injury that has resulted in pain or tingling elsewhere in your arm. Document Released: 01/01/2005 Document Revised: 05/18/2013 Document Reviewed: 04/15/2008 [facility] Patient Information 2015 [place], [place]. This information is not intended to replace advice given to you by your health care provider. Make sure you discuss any questions you have with your health care provider.

## 2014-05-20 ENCOUNTER — Encounter (HOSPITAL_COMMUNITY): Payer: Self-pay | Admitting: Emergency Medicine

## 2014-05-20 ENCOUNTER — Emergency Department (HOSPITAL_COMMUNITY)
Admission: EM | Admit: 2014-05-20 | Discharge: 2014-05-20 | Disposition: A | Discharge status: Home / Self Care | Payer: Self-pay | Attending: Emergency Medicine | Admitting: Emergency Medicine

## 2014-05-20 ENCOUNTER — Emergency Department (HOSPITAL_COMMUNITY): Payer: Self-pay

## 2014-05-20 DIAGNOSIS — Z8669 Personal history of other diseases of the nervous system and sense organs: Secondary | ICD-10-CM | POA: Insufficient documentation

## 2014-05-20 DIAGNOSIS — R079 Chest pain, unspecified: Secondary | ICD-10-CM | POA: Insufficient documentation

## 2014-05-20 DIAGNOSIS — Z7982 Long term (current) use of aspirin: Secondary | ICD-10-CM | POA: Insufficient documentation

## 2014-05-20 HISTORY — DX: Carpal tunnel syndrome, unspecified upper limb: G56.00

## 2014-05-20 LAB — BASIC METABOLIC PANEL
ANION GAP: 10 (ref 5–15)
BUN: 14 mg/dL (ref 6–20)
CHLORIDE: 106 mmol/L (ref 101–111)
CO2: 22 mmol/L (ref 22–32)
Calcium: 9.1 mg/dL (ref 8.9–10.3)
Creatinine, Ser: 1.08 mg/dL (ref 0.61–1.24)
GFR calc Af Amer: >60 mL/min (ref >60)
GFR calc non Af Amer: >60 mL/min (ref >60)
Glucose, Bld: 98 mg/dL (ref 70–99)
POTASSIUM: 3.9 mmol/L (ref 3.5–5.1)
Sodium: 138 mmol/L (ref 135–145)

## 2014-05-20 LAB — CBC
HCT: 42.5 % (ref 39.0–52.0)
HEMOGLOBIN: 14.8 g/dL (ref 13.0–17.0)
MCH: 28 pg (ref 26.0–34.0)
MCHC: 34.8 g/dL (ref 30.0–36.0)
MCV: 80.3 fL (ref 78.0–100.0)
PLATELETS: 284 10*3/uL (ref 150–400)
RBC: 5.29 MIL/uL (ref 4.22–5.81)
RDW: 13.8 % (ref 11.5–15.5)
WBC: 5.1 10*3/uL (ref 4.0–10.5)

## 2014-05-20 LAB — I-STAT TROPONIN, ED: TROPONIN I, POC: 0 ng/mL (ref 0.00–0.08)

## 2014-05-20 MED ORDER — OMEPRAZOLE 20 MG PO CPDR: 20.0000 mg | DELAYED_RELEASE_CAPSULE | Freq: Every day | ORAL | Status: DC

## 2014-05-20 MED ORDER — NAPROXEN 500 MG PO TABS: 500.0000 mg | ORAL_TABLET | Freq: Two times a day (BID) | ORAL | Status: DC

## 2014-05-20 NOTE — ED Provider Notes (Signed)
CSN: [id number]     Arrival date & time 05/20/14  1937 History   First MD Initiated Contact with Patient 05/20/14 2015     Chief Complaint  Patient presents with  . Chest Pain   (Consider location/radiation/quality/duration/timing/severity/associated sxs/prior Treatment) HPI  Edwin Cline is a 39 yo male presenting with report of chest pain. He states the pain began while driving home around 5:64 pm.  He reports several instances of sharp pain around his sternum and left anterior chest wall.  The pain seemed to be worse with different positions like bending over or lying flat. The pain is improved with lying on his side.  The pain is reproduced with pushing on his chest wall. He currently rates his pain as 0/10. He did take Aspirin 650 mg at home. He denies recent fevers, chills, cough, nausea, vomiting, recent travel, surgery, unilateral leg swelling or hemoptysis.     Past Medical History  Diagnosis Date  . Carpal tunnel syndrome    Past Surgical History  Procedure Laterality Date  . Back surgery    . Spine surgery     Family History  Problem Relation Age of Onset  . Diabetes Father    History  Substance Use Topics  . Smoking status: Never Smoker   . Smokeless tobacco: Not on file  . Alcohol Use: Yes     Comment: occasional    Review of Systems  Constitutional: Negative for fever and chills.  HENT: Negative for sore throat.   Eyes: Negative for visual disturbance.  Respiratory: Negative for cough and shortness of breath.   Cardiovascular: Positive for chest pain. Negative for leg swelling.  Gastrointestinal: Negative for nausea, vomiting and diarrhea.  Genitourinary: Negative for dysuria.  Musculoskeletal: Negative for myalgias.  Skin: Negative for rash.  Neurological: Negative for weakness, numbness and headaches.      Allergies  Review of patient's allergies indicates no known allergies.  Home Medications   Prior to Admission medications   Medication Sig  Start Date End Date Taking? Authorizing Provider  aspirin 325 MG tablet Take 650 mg by mouth daily.   Yes Historical Provider, MD  oxyCODONE-acetaminophen (PERCOCET) 10-325 MG per tablet Take 1 tablet by mouth 3 (three) times daily as needed. for pain 05/11/14  Yes Historical Provider, MD  amitriptyline (ELAVIL) 25 MG tablet Take 1 tablet (25 mg total) by mouth at bedtime as needed (pain). Patient not taking: Reported on 05/20/2014 04/15/14   Gregor Hams, MD  HYDROcodone-acetaminophen (NORCO/VICODIN) 5-325 MG per tablet Take 1 tablet by mouth every 6 (six) hours as needed. Patient not taking: Reported on 05/20/2014 12/24/13   Gregor Hams, MD  PredniSONE 10 MG KIT 12 day dose pack po Patient not taking: Reported on 05/20/2014 12/20/13   Gregor Hams, MD   BP 120/77 mmHg  Pulse 57  Temp(Src) 98.5 F (36.9 C) (Oral)  Resp 18  Ht _0  (1.905 m)  Wt 195 lb (88.451 kg)  BMI 24.37 kg/m2  SpO2 99% Physical Exam  Constitutional: He appears well-developed and well-nourished. No distress.  HENT:  Head: Normocephalic and atraumatic.  Mouth/Throat: Oropharynx is clear and moist. No oropharyngeal exudate.  Eyes: Conjunctivae are normal.  Neck: Neck supple. No thyromegaly present.  Cardiovascular: Normal rate, regular rhythm and intact distal pulses.   Pulmonary/Chest: Effort normal and breath sounds normal. No respiratory distress. He has no decreased breath sounds. He has no wheezes. He has no rhonchi. He has no rales. He exhibits tenderness.  Abdominal: Soft. There is no tenderness.  Musculoskeletal: He exhibits no tenderness.  Lymphadenopathy:    He has no cervical adenopathy.  Neurological: He is alert.  Skin: Skin is warm and dry. No rash noted. He is not diaphoretic.  Psychiatric: He has a normal mood and affect.  Nursing note and vitals reviewed.   ED Course  Procedures (including critical care time) Labs Review Labs Reviewed  CBC  BASIC METABOLIC PANEL  I-STAT [place], ED    I-STAT CHEM 8, ED    Imaging Review Dg Chest 2 View  05/20/2014   CLINICAL DATA:  Left-sided chest pain with shortness of breath  EXAM: CHEST  2 VIEW  COMPARISON:  03/20/2007  FINDINGS: No cardiomegaly. Negative aortic and hilar contours. Minimal linear densities at the bases have a morphology consistent with atelectasis. No edema, effusion, or pneumothorax. No osseous findings to explain chest pain.  IMPRESSION: Mild bibasilar atelectasis.   Electronically Signed   By: Monte Fantasia M.D.   On: 05/20/2014 21:11     EKG Interpretation   Date/Time:  Thursday May 20 2014 19:38:37 EDT Ventricular Rate:  55 PR Interval:  199 QRS Duration: 93 QT Interval:  417 QTC Calculation: 399 R Axis:   46 Text Interpretation:  Sinus rhythm Consider left ventricular hypertrophy  since last tracing no significant change Confirmed by Eulis Foster  MD, Vira Agar  ([id number]) on 05/20/2014 8:20:55 PM      MDM   Final diagnoses:  Chest pain, unspecified chest pain type   39 yo with reproducible chest pain not likely of cardiac or pulmonary etiology d/t presentation, perc negative, VSS, no tracheal deviation, no JVD or new murmur, RRR, breath sounds equal bilaterally, EKG without acute abnormalities, negative troponin, and negative CXR. His pain has resolved without treatment in the ED.  Pt has been advised start a NSAIDs and a PPI. Return precautions include if CP becomes exertional, associated with diaphoresis or nausea, radiates to left jaw/arm, worsens or becomes concerning in any way. Case has been discussed with and seen by Dr. Eulis Foster who agrees with the above plan to discharge. Pt is well-appearing, in no acute distress and vital signs reviewed and not concerning.  He appears safe to be discharged.  Discharge include resources to establish care with a PCP.  Return precautions provided. Pt aware of plan and in agreement. Pt appears reliable for follow up and is agreeable to discharge.    Filed Vitals:   05/20/14  2145 05/20/14 2211 05/20/14 2215 05/20/14 2218  BP: 110/65 117/71 110/62   Pulse: 59 50 50   Temp:    97.6 F (36.4 C)  TempSrc:    Oral  Resp: _0 Height:      Weight:      SpO2: 95% 100% 96%    Meds given in ED:  Medications - No data to display  Discharge Medication List as of 05/20/2014  9:56 PM    START taking these medications   Details  naproxen (NAPROSYN) 500 MG tablet Take 1 tablet (500 mg total) by mouth 2 (two) times daily., Starting 05/20/2014, Until Discontinued, Print    omeprazole (PRILOSEC) 20 MG capsule Take 1 capsule (20 mg total) by mouth daily., Starting 05/20/2014, Until Discontinued, Print           Britt Bottom, NP 05/21/14 [place], MD 05/25/14 [phone]
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
## 2014-05-20 NOTE — Discharge Instructions (Signed)
Please follow the directions provided.  Use the resource guide to [facility] with a primary care provider.  Take the naproxen twice a day to help with pain.  Take the omeprazole daily to protect your stomach.  Don't hesitate to return for any new, worsening or concerning symptoms.     SEEK IMMEDIATE MEDICAL CARE IF:  You have increased chest pain or pain that spreads to your arm, neck, jaw, back, or abdomen.  You have shortness of breath.  You have an increasing cough, or you cough up blood.  You have severe back or abdominal pain.  You feel nauseous or vomit.  You have severe weakness.  You faint.  You have chills. This is an emergency. Do not wait to see if the pain will go away. Get medical help at once. Call your local emergency services (911 in [place] Do not drive yourself to the hospital.

## 2016-03-15 ENCOUNTER — Encounter (HOSPITAL_COMMUNITY): Payer: Self-pay

## 2016-03-15 ENCOUNTER — Emergency Department (HOSPITAL_COMMUNITY)
Admission: EM | Admit: 2016-03-15 | Discharge: 2016-03-15 | Disposition: A | Discharge status: Home / Self Care | Payer: Self-pay | Attending: Emergency Medicine | Admitting: Emergency Medicine

## 2016-03-15 ENCOUNTER — Emergency Department (HOSPITAL_COMMUNITY): Payer: Self-pay

## 2016-03-15 DIAGNOSIS — Z5321 Procedure and treatment not carried out due to patient leaving prior to being seen by health care provider: Secondary | ICD-10-CM | POA: Insufficient documentation

## 2016-03-15 DIAGNOSIS — R040 Epistaxis: Secondary | ICD-10-CM | POA: Insufficient documentation

## 2016-03-15 LAB — CBC
HEMATOCRIT: 43.6 % (ref 39.0–52.0)
HEMOGLOBIN: 15.1 g/dL (ref 13.0–17.0)
MCH: 27.1 pg (ref 26.0–34.0)
MCHC: 34.6 g/dL (ref 30.0–36.0)
MCV: 78.1 fL (ref 78.0–100.0)
Platelets: 312 10*3/uL (ref 150–400)
RBC: 5.58 MIL/uL (ref 4.22–5.81)
RDW: 13.6 % (ref 11.5–15.5)
WBC: 5.1 10*3/uL (ref 4.0–10.5)

## 2016-03-15 LAB — BASIC METABOLIC PANEL
ANION GAP: 9 (ref 5–15)
BUN: 20 mg/dL (ref 6–20)
CHLORIDE: 103 mmol/L (ref 101–111)
CO2: 27 mmol/L (ref 22–32)
Calcium: 9.6 mg/dL (ref 8.9–10.3)
Creatinine, Ser: 1.29 mg/dL — ABNORMAL HIGH (ref 0.61–1.24)
GFR calc Af Amer: >60 mL/min (ref >60)
GFR calc non Af Amer: >60 mL/min (ref >60)
GLUCOSE: 100 mg/dL — AB (ref 65–99)
POTASSIUM: 4.6 mmol/L (ref 3.5–5.1)
Sodium: 139 mmol/L (ref 135–145)

## 2016-03-15 LAB — I-STAT TROPONIN, ED: Troponin i, poc: 0 ng/mL (ref 0.00–0.08)

## 2016-03-15 NOTE — ED Notes (Signed)
Call for pt to take him to a room.  No answer

## 2016-03-15 NOTE — ED Notes (Signed)
Pt called several times with no response. 

## 2016-03-15 NOTE — ED Notes (Signed)
Pt called for v/s recheck, no response from lobby 

## 2016-03-15 NOTE — ED Triage Notes (Signed)
Pt states that over the last 4 days, he has been experiencing generalized chest pain, epistaxis, lightheadedness, and headache. He states that he had 2 severe episodes of epistaxis yesterday and today. He states that as son as he got today's under control, he came here. A&Ox4. Ambulatory.

## 2016-04-02 ENCOUNTER — Ambulatory Visit (HOSPITAL_COMMUNITY)
Admission: EM | Admit: 2016-04-02 | Discharge: 2016-04-02 | Disposition: A | Discharge status: Home / Self Care | Payer: Self-pay | Attending: Family Medicine | Admitting: Family Medicine

## 2016-04-02 ENCOUNTER — Encounter (HOSPITAL_COMMUNITY): Payer: Self-pay | Admitting: Emergency Medicine

## 2016-04-02 DIAGNOSIS — S161XXA Strain of muscle, fascia and tendon at neck level, initial encounter: Secondary | ICD-10-CM

## 2016-04-02 DIAGNOSIS — M25512 Pain in left shoulder: Secondary | ICD-10-CM

## 2016-04-02 MED ORDER — CYCLOBENZAPRINE HCL 10 MG PO TABS: 10.0000 mg | ORAL_TABLET | Freq: Two times a day (BID) | ORAL | 0 refills | Status: DC | PRN

## 2016-04-02 MED ORDER — NAPROXEN 500 MG PO TABS: 500.0000 mg | ORAL_TABLET | Freq: Two times a day (BID) | ORAL | 0 refills | Status: DC
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
## 2016-08-07 ENCOUNTER — Ambulatory Visit (HOSPITAL_COMMUNITY)
Admission: EM | Admit: 2016-08-07 | Discharge: 2016-08-07 | Disposition: A | Discharge status: Home / Self Care | Payer: BLUE CROSS/BLUE SHIELD | Attending: Family Medicine | Admitting: Family Medicine

## 2016-08-07 ENCOUNTER — Ambulatory Visit (INDEPENDENT_AMBULATORY_CARE_PROVIDER_SITE_OTHER): Payer: BLUE CROSS/BLUE SHIELD

## 2016-08-07 ENCOUNTER — Encounter (HOSPITAL_COMMUNITY): Payer: Self-pay | Admitting: Family Medicine

## 2016-08-07 DIAGNOSIS — S83422A Sprain of lateral collateral ligament of left knee, initial encounter: Secondary | ICD-10-CM

## 2016-08-07 MED ORDER — MELOXICAM 15 MG PO TABS: 15.0000 mg | ORAL_TABLET | Freq: Every day | ORAL | 0 refills | Status: DC
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
## 2016-08-07 NOTE — ED Triage Notes (Signed)
Pt here for left knee pain since working out in the gym this past Friday. sts unstable and popping.

## 2016-08-07 NOTE — Discharge Instructions (Signed)
Do not bear weight for one week, use your crutches, I have prescribed Meloxicam, take one tablet daily. Ice your knee 15 minutes at a time every 2-4 hours. Follow up with an orthopedist as needed.

## 2016-09-02 ENCOUNTER — Emergency Department (HOSPITAL_COMMUNITY)
Admission: EM | Admit: 2016-09-02 | Discharge: 2016-09-02 | Disposition: A | Discharge status: Home / Self Care | Payer: BLUE CROSS/BLUE SHIELD | Attending: Emergency Medicine | Admitting: Emergency Medicine

## 2016-09-02 ENCOUNTER — Encounter (HOSPITAL_COMMUNITY): Payer: Self-pay | Admitting: Emergency Medicine

## 2016-09-02 DIAGNOSIS — M79602 Pain in left arm: Secondary | ICD-10-CM

## 2016-09-02 DIAGNOSIS — M5412 Radiculopathy, cervical region: Secondary | ICD-10-CM | POA: Insufficient documentation

## 2016-09-02 DIAGNOSIS — Z79899 Other long term (current) drug therapy: Secondary | ICD-10-CM | POA: Insufficient documentation

## 2016-09-02 LAB — I-STAT CHEM 8, ED
BUN: 15 mg/dL (ref 6–20)
CHLORIDE: 103 mmol/L (ref 101–111)
Calcium, Ion: 1.13 mmol/L — ABNORMAL LOW (ref 1.15–1.40)
Creatinine, Ser: 1.4 mg/dL — ABNORMAL HIGH (ref 0.61–1.24)
Glucose, Bld: 88 mg/dL (ref 65–99)
HCT: 45 % (ref 39.0–52.0)
HEMOGLOBIN: 15.3 g/dL (ref 13.0–17.0)
POTASSIUM: 3.9 mmol/L (ref 3.5–5.1)
SODIUM: 141 mmol/L (ref 135–145)
TCO2: 26 mmol/L (ref 0–100)

## 2016-09-02 LAB — I-STAT TROPONIN, ED: TROPONIN I, POC: 0 ng/mL (ref 0.00–0.08)

## 2016-09-02 MED ORDER — CYCLOBENZAPRINE HCL 5 MG PO TABS
7.5000 mg | ORAL_TABLET | Freq: Once | ORAL | Status: AC
  Administered 2016-09-02: 7.5 mg via ORAL
  Filled 2016-09-02: qty 1.5

## 2016-09-02 MED ORDER — KETOROLAC TROMETHAMINE 15 MG/ML IJ SOLN
15.0000 mg | Freq: Once | INTRAMUSCULAR | Status: AC
  Administered 2016-09-02: 15 mg via INTRAVENOUS
  Filled 2016-09-02: qty 1

## 2016-09-02 NOTE — ED Triage Notes (Addendum)
Per EMS- Pt has had three days of left shoulder pain radiating into left elbow, up into left neck. Pt describes it as a pressure to his left shoulder. Tingling with numbness going into his left fingers that comes and goes. Pt was diaphoretic upon EMS arrival, EKG obtained, 12 lead sinus rhythm. 18G PIV to LAC. Pt has pain to left shoulder with palpation, limited range of motion to left arm currently. Denies injury.  PT also stating "It is going into  My chest" while pointing to his left shoulder."

## 2016-09-02 NOTE — Discharge Instructions (Signed)
Please contact a primary care facility and schedule an appointment for follow-up of your recent ED visit concerning the shoulder pain. We have included the number to one that you may call below. At that time, MRI may be considered given the presentation of your pain.  If the pain continues to worsen between now and that visit and you develop concerning symptoms at that time, please return to the emergency department. Concerning symptoms include chest pain accompanied by nausea, sweating, or shortness of breath.  You may use rest, ibuprofen, acetaminophen or heat as appropriate to control the symptoms if needed. Please do not exceed the daily recommended dosage of ibuprofen or acetaminophen. You should also reduce the amount of time heat is applied according to the manufactures recommendations listed on the device utilized.   Thank you for your visit to the [facility] ED.

## 2016-09-02 NOTE — ED Notes (Signed)
ED Provider at bedside. 

## 2016-09-02 NOTE — ED Notes (Signed)
Family at bedside. 

## 2016-09-02 NOTE — ED Provider Notes (Signed)
[facility] Provider Note   CSN: [id number] Arrival date & time: 09/02/16  1714   History   Chief Complaint Chief Complaint  Patient presents with  . Arm Pain    left    HPI Edwin Cline is a 41 y.o. male.  Presented with three days of left neck, shoulder and arm pain that is accompanied by numbness and tingling. He has a past medical history significant for carpal tunnel syndrome, back pain and spine surgery. He stated that the pain is worse with lying down or utilization of the arm. It is relieved by rest, standing or walking. It begins in his neck/trapezius area and radiates down to his fingers. He as associated numbness and tingling in that hand that does not resemble the carpal tunnel he has experienced in the past. He admitted to sweating and mild chest pain while being evaluated by EMS who obtained and EKG at that time demonstrating normal sinus rhythm. Patient did not attempt to relieve the pain with ice, heat, NSAIDS, or acetaminophen.       Past Medical History:  Diagnosis Date  . Carpal tunnel syndrome     There are no active problems to display for this patient.   Past Surgical History:  Procedure Laterality Date  . BACK SURGERY    . SPINE SURGERY         Home Medications    Prior to Admission medications   Medication Sig Start Date End Date Taking? Authorizing Provider  cyclobenzaprine (FLEXERIL) 10 MG tablet Take 1 tablet (10 mg total) by mouth 2 (two) times daily as needed for muscle spasms. 04/02/16   Deatra Canter, FNP  meloxicam (MOBIC) 15 MG tablet Take 1 tablet (15 mg total) by mouth daily. 08/07/16   Dorena Bodo, NP  naproxen (NAPROSYN) 500 MG tablet Take 1 tablet (500 mg total) by mouth 2 (two) times daily with a meal. 04/02/16   Deatra Canter, FNP  oxyCODONE-acetaminophen (PERCOCET) 10-325 MG per tablet Take 1 tablet by mouth 3 (three) times daily as needed. for pain 05/11/14   [provider]    Family  History Family History  Problem Relation Age of Onset  . Diabetes Father     Social History Social History  Substance Use Topics  . Smoking status: Never Smoker  . Smokeless tobacco: Never Used  . Alcohol use Yes     Comment: occasional     Allergies   Patient has no known allergies.   Review of Systems Review of Systems  Constitutional: Negative for chills and fever.  HENT: Negative for ear pain and sore throat.   Eyes: Negative for pain and visual disturbance.  Respiratory: Negative for cough and shortness of breath.   Cardiovascular: Positive for chest pain (Began during the evaluation by the MD after palpation of his chest overlying the right sternal border. ). Negative for palpitations.  Gastrointestinal: Negative for abdominal pain, constipation, diarrhea and vomiting.  Genitourinary: Negative for dysuria and hematuria.  Musculoskeletal: Positive for back pain, joint swelling and neck pain.  Skin: Negative for color change and rash.  Neurological: Negative for seizures, syncope and headaches.  All other systems reviewed and are negative.    Physical Exam Updated Vital Signs BP 115/83   Pulse 64   Temp 98 F (36.7 C) (Oral)   Resp 20   SpO2 99%   Physical Exam  Constitutional: He is oriented to person, place, and time. He appears well-developed and well-nourished.  HENT:  Head:  Normocephalic and atraumatic.  Eyes: Pupils are equal, round, and reactive to light. Conjunctivae and EOM are normal. No scleral icterus.  Neck: Normal range of motion. Neck supple. No JVD present. No tracheal deviation present. No thyromegaly present.  Cardiovascular: Normal rate and regular rhythm.   Murmur heard. Pulmonary/Chest: Effort normal and breath sounds normal. No respiratory distress.  Abdominal: Soft. Bowel sounds are normal. He exhibits no distension. There is no tenderness.  Musculoskeletal: He exhibits tenderness (ROM decreased in left shoulder,). He exhibits no  edema.       Left shoulder: He exhibits decreased range of motion and tenderness. He exhibits no bony tenderness, no swelling and no effusion.  Strength 4/5 upper and lower bilaterally-secondary to decreased effort vs weakness.  Pain in the left shoulder with active abduction, minimal pain with passive abduction of the left shoulder. Tender to palpation over the left cervical vertebra 5-7 as well as the upper trapezius muscles.   Lymphadenopathy:    He has no cervical adenopathy.  Neurological: He is alert and oriented to person, place, and time. No cranial nerve deficit. Coordination normal.  Skin: Skin is warm and dry. Capillary refill takes 2 to 3 seconds.  Psychiatric: He has a normal mood and affect. His behavior is normal. Judgment and thought content normal.  Nursing note and vitals reviewed.    ED Treatments / Results  Labs (all labs ordered are listed, but only abnormal results are displayed) Labs Reviewed  I-STAT CHEM 8, ED - Abnormal; Notable for the following:       Result Value   Creatinine, Ser 1.40 (*)    Calcium, Ion 1.13 (*)    All other components within normal limits  I-STAT TROPONIN, ED  I-STAT TROPONIN, ED    EKG  EKG Interpretation  Date/Time:  Sunday September 02 2016 17:38:04 EDT Ventricular Rate:  77 PR Interval:    QRS Duration: 99 QT Interval:  383 QTC Calculation: 434 R Axis:   70 Text Interpretation:  Sinus rhythm Left atrial enlargement Left ventricular hypertrophy Confirmed by Blane Ohara [phone]) on 09/02/2016 6:10:29 PM Also confirmed by Blane Ohara [phone]), editor Elita Quick ([id number])  on 09/03/2016 6:57:08 AM       Radiology No results found.  Procedures Procedures (including critical care time)  Medications Ordered in ED Medications  cyclobenzaprine (FLEXERIL) tablet 7.5 mg (7.5 mg Oral Given 09/02/16 1927)  ketorolac (TORADOL) 15 MG/ML injection 15 mg (15 mg Intravenous Given 09/02/16 1925)     Initial Impression /  Assessment and Plan / ED Course  I have reviewed the triage vital signs and the nursing notes.  Pertinent labs & imaging results that were available during my care of the patient were reviewed by me and considered in my medical decision making (see chart for details).   Patient presented with right shoulder pain for three days that radiated down his arm and into his neck with associated numbness and tingling.   6:00 PM, ECG reviewed and unremarkable. I-stat troponin ordered. Flexeril given. Will reassess after completion or if symptoms change.   7:05 PM, Patient feeling better but still having the shoulder pain. He was informed that we would wait for the basic lab tests to return prior to discharge.   7:35 PM, Patient notified that his labs had returned to be unremarkable compared to previous labs. Troponin was not elevated. He was advised to establish with a primary care physician upon discharge for his recurrent shoulder pain. Patient was further advised to  utilize ice, heat, ibuprofen and acetaminophen as needed for pain. He was advised not to exceed a daily ibuprofen dose of 800mg  TID for three days as well as acetaminophen 500mg  four times daily as needed for pain. If at any time his symptoms worsened, he was advised to return to the ED for further evaluation.   Patient seen and evaluated with Dr. Jodi Mourning.  Final Clinical Impressions(s) / ED Diagnoses   Final diagnoses:  Cervical radiculopathy  Left arm pain    New Prescriptions Discharge Medication List as of 09/02/2016  7:26 PM       Lanelle Bal, MD 09/03/16 [id number]    Blane Ohara, MD 09/05/16 [phone]

## 2017-03-17 ENCOUNTER — Ambulatory Visit (HOSPITAL_COMMUNITY)
Admission: EM | Admit: 2017-03-17 | Discharge: 2017-03-17 | Disposition: A | Discharge status: Home / Self Care | Payer: Self-pay | Attending: Physician Assistant | Admitting: Physician Assistant

## 2017-03-17 ENCOUNTER — Other Ambulatory Visit: Payer: Self-pay

## 2017-03-17 ENCOUNTER — Encounter (HOSPITAL_COMMUNITY): Payer: Self-pay | Admitting: *Deleted

## 2017-03-17 DIAGNOSIS — M25562 Pain in left knee: Secondary | ICD-10-CM

## 2017-03-17 MED ORDER — MELOXICAM 15 MG PO TABS: 15.0000 mg | ORAL_TABLET | Freq: Every day | ORAL | 0 refills | Status: DC

## 2017-03-17 NOTE — ED Provider Notes (Addendum)
03/17/2017 8:00 PM   DOB: January 03, 1976 / MRN: [id number]  SUBJECTIVE:  Edwin Cline is a 42 y.o. male presenting for left-sided knee pain.  He has a history of this problem and had a normal x-ray roughly 1 year ago.  He did see [facility] and they advised that he receive arthroscopic knee surgery.  He is also received an intra-articular shot in the past and this seemed to help.  He would like an x-ray tonight.  He asked me "what am I supposed to do about work?"  He has No Known Allergies.   He  has a past medical history of Carpal tunnel syndrome.    He  reports that  has never smoked. he has never used smokeless tobacco. He reports that he drinks alcohol. He reports that he does not use drugs. He  has no sexual activity history on file. The patient  has a past surgical history that includes Back surgery and Spine surgery.  His family history includes Diabetes in his father.  Review of Systems  Musculoskeletal: Positive for joint pain. Negative for back pain, falls, myalgias and neck pain.    OBJECTIVE:  There were no vitals taken for this visit.  Physical Exam  Constitutional: He appears well-developed. He is active.  Non-toxic appearance.  Cardiovascular: Normal rate.  Musculoskeletal: Normal range of motion. He exhibits no edema or deformity.       Left knee: He exhibits bony tenderness. He exhibits normal range of motion, no swelling, no effusion, no ecchymosis, no deformity, no laceration, no erythema, normal alignment, no LCL laxity, normal patellar mobility, normal meniscus and no MCL laxity. Tenderness found. Medial joint line and lateral joint line tenderness noted. No MCL, no LCL and no patellar tendon tenderness noted.  Skin: Skin is warm and dry. He is not diaphoretic. No pallor.    No results found for this or any previous visit (from the past 72 hour(s)).  No results found.  ASSESSMENT AND PLAN:  Orders Placed This Encounter  Procedures  . Apply ace wrap    Please  teach patient how to wrap the left knee for pain relief and compression.    Standing Status:   Standing    Number of Occurrences:   1     Acute pain of left knee: Meloxicam, Ace wrap.  Advised that he call his orthopedist as surgery has been  recommended for him in the past.      The patient is advised to call or return to clinic if he does not see an improvement in symptoms, or to seek the care of the closest emergency department if he worsens with the above plan.   Deliah BostonMichael Clark, MHS, PA-C 03/17/2017 8:00 PM   Ofilia Neaslark, Michael L, PA-C 03/17/17 2001    Ofilia Neaslark, Michael L, PA-C 03/17/17 2001
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
## 2017-03-17 NOTE — Discharge Instructions (Signed)
Please call your orthopedist.  Your knee exam aside from some tenderness is normal.  Please start the meloxicam.  You may also consider icing your knee.

## 2019-03-06 ENCOUNTER — Ambulatory Visit (HOSPITAL_COMMUNITY)
Admission: EM | Admit: 2019-03-06 | Discharge: 2019-03-06 | Disposition: A | Discharge status: Home / Self Care | Payer: Self-pay | Attending: Family Medicine | Admitting: Family Medicine

## 2019-03-06 ENCOUNTER — Ambulatory Visit (INDEPENDENT_AMBULATORY_CARE_PROVIDER_SITE_OTHER): Payer: Self-pay

## 2019-03-06 ENCOUNTER — Encounter (HOSPITAL_COMMUNITY): Payer: Self-pay | Admitting: Emergency Medicine

## 2019-03-06 ENCOUNTER — Other Ambulatory Visit: Payer: Self-pay

## 2019-03-06 DIAGNOSIS — S46812A Strain of other muscles, fascia and tendons at shoulder and upper arm level, left arm, initial encounter: Secondary | ICD-10-CM

## 2019-03-06 DIAGNOSIS — M542 Cervicalgia: Secondary | ICD-10-CM

## 2019-03-06 MED ORDER — KETOROLAC TROMETHAMINE 60 MG/2ML IM SOLN
60.0000 mg | Freq: Once | INTRAMUSCULAR | Status: AC
  Administered 2019-03-06: 19:00:00 60 mg via INTRAMUSCULAR

## 2019-03-06 MED ORDER — IBUPROFEN 800 MG PO TABS: 800.0000 mg | ORAL_TABLET | Freq: Three times a day (TID) | ORAL | 0 refills | Status: DC | PRN

## 2019-03-06 MED ORDER — KETOROLAC TROMETHAMINE 60 MG/2ML IM SOLN
INTRAMUSCULAR | Status: AC
  Filled 2019-03-06: qty 2

## 2019-03-06 MED ORDER — CYCLOBENZAPRINE HCL 10 MG PO TABS: 10.0000 mg | ORAL_TABLET | Freq: Two times a day (BID) | ORAL | 0 refills | Status: DC | PRN
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
## 2020-01-24 ENCOUNTER — Emergency Department (HOSPITAL_COMMUNITY)
Admission: EM | Admit: 2020-01-24 | Discharge: 2020-01-24 | Disposition: A | Discharge status: Home / Self Care | Payer: Self-pay | Attending: Emergency Medicine | Admitting: Emergency Medicine

## 2020-01-24 ENCOUNTER — Other Ambulatory Visit: Payer: Self-pay

## 2020-01-24 ENCOUNTER — Encounter (HOSPITAL_COMMUNITY): Payer: Self-pay | Admitting: Student

## 2020-01-24 DIAGNOSIS — T7802XA Anaphylactic reaction due to shellfish (crustaceans), initial encounter: Secondary | ICD-10-CM | POA: Insufficient documentation

## 2020-01-24 DIAGNOSIS — K130 Diseases of lips: Secondary | ICD-10-CM | POA: Insufficient documentation

## 2020-01-24 DIAGNOSIS — T7840XA Allergy, unspecified, initial encounter: Secondary | ICD-10-CM

## 2020-01-24 DIAGNOSIS — R0600 Dyspnea, unspecified: Secondary | ICD-10-CM | POA: Insufficient documentation

## 2020-01-24 DIAGNOSIS — J392 Other diseases of pharynx: Secondary | ICD-10-CM | POA: Insufficient documentation

## 2020-01-24 MED ORDER — DIPHENHYDRAMINE HCL 25 MG PO TABS: 25.0000 mg | ORAL_TABLET | Freq: Four times a day (QID) | ORAL | 0 refills | Status: DC | PRN

## 2020-01-24 MED ORDER — DEXAMETHASONE SODIUM PHOSPHATE 10 MG/ML IJ SOLN
10.0000 mg | Freq: Once | INTRAMUSCULAR | Status: AC
  Administered 2020-01-24: 10 mg via INTRAVENOUS
  Filled 2020-01-24: qty 1

## 2020-01-24 MED ORDER — FAMOTIDINE 20 MG PO TABS: 20.0000 mg | ORAL_TABLET | Freq: Two times a day (BID) | ORAL | 0 refills | Status: DC | PRN

## 2020-01-24 MED ORDER — EPINEPHRINE 0.3 MG/0.3ML IJ SOAJ: 0.3000 mg | INTRAMUSCULAR | 0 refills | Status: DC | PRN

## 2020-01-24 MED ORDER — METHYLPREDNISOLONE SODIUM SUCC 125 MG IJ SOLR
125.0000 mg | Freq: Once | INTRAMUSCULAR | Status: DC
Start: 2020-01-24 — End: 2020-01-24

## 2020-01-24 MED ORDER — FAMOTIDINE IN NACL 20-0.9 MG/50ML-% IV SOLN
20.0000 mg | Freq: Once | INTRAVENOUS | Status: AC
  Administered 2020-01-24: 20 mg via INTRAVENOUS
  Filled 2020-01-24: qty 50

## 2020-01-24 MED ORDER — EPINEPHRINE 0.3 MG/0.3ML IJ SOAJ
0.3000 mg | Freq: Once | INTRAMUSCULAR | Status: AC
Start: 2020-01-24 — End: 2020-01-24
  Administered 2020-01-24: 0.3 mg via INTRAMUSCULAR
  Filled 2020-01-24: qty 0.3

## 2020-01-24 NOTE — ED Notes (Signed)
Pt does not appear in distress, respirations are even and non-labored  Skin is warm, dry and intact.   Pt able to speak in full and clear sentences, denies change in sound of voice.  Airway is clear, lips appear swollen. Managing own secretions  Updated on plan of care and call light within reach

## 2020-01-24 NOTE — ED Triage Notes (Addendum)
PT presents to ED POV. Pt reports that he ates shrimp at 2130. Pt states that he began having hives but woke up because he was having difficulty breathing. Airway currently intact. Some edema noted in eyes and lips

## 2020-01-24 NOTE — Discharge Instructions (Addendum)
You were seen in the ER today after an allergic reaction.  You were given steroids, pepcid, and epineprhine.   Please take benadryl every 6 hours and pepcid every 12 hours as needed for itching/rash/swelling.  We are also sending you home with an epi pen.  Please see attached handout for indications and how to use. If you use the pen you will need to seek emergency department care.   We have prescribed you new medication(s) today. Discuss the medications prescribed today with your pharmacist as they can have adverse effects and interactions with your other medicines including over the counter and prescribed medications. Seek medical evaluation if you start to experience new or abnormal symptoms after taking one of these medicines, seek care immediately if you start to experience difficulty breathing, feeling of your throat closing, facial swelling, or rash as these could be indications of a more serious allergic reaction  Do not eat shrimp/shell fish.   Please follow up with your primary care provider within 3 days.  Please return to the ER for new or worsening symptoms including but not limited to facial swelling, trouble breathing, sensation of throat closing, vomiting, dizziness, passing out, having to use your epi pen or any other concerns.

## 2020-01-24 NOTE — ED Notes (Signed)
Pt placed on cardiac monitor in triage . Pt airway intact

## 2020-01-24 NOTE — ED Provider Notes (Signed)
[facility] EMERGENCY DEPARTMENT Provider Note   CSN: [id number] Arrival date & time: 01/24/20  0239     History Chief Complaint  Patient presents with  . Allergic Reaction    Edwin Cline is a 45 y.o. male without significant past medical hx who presents to the ED with complaints of allergic reaction that began around midnight. Patient states that he ate shrimp @ 21:30 last night, went to bed feeling okay, then woke up around midnight with pruritic hives, sensation of a knot in his throat, & dyspnea. Sxs worsened, associated nausea developed. He thinks his lips feel a bit swollen. He took 2 benadryl tablets PTA, but has not had much improvement. He has had a hive reaction to shrimp in the past but additional sxs are new. He denies vomiting, dizziness, syncope, chest pain, or abdominal pain.    HPI     Past Medical History:  Diagnosis Date  . Carpal tunnel syndrome     There are no problems to display for this patient.   Past Surgical History:  Procedure Laterality Date  . BACK SURGERY    . SPINE SURGERY         Family History  Problem Relation Age of Onset  . Diabetes Father     Social History   Tobacco Use  . Smoking status: Never Smoker  . Smokeless tobacco: Never Used  Vaping Use  . Vaping Use: Never used  Substance Use Topics  . Alcohol use: Yes    Comment: occasional  . Drug use: No    Home Medications Prior to Admission medications   Medication Sig Start Date End Date Taking? Authorizing Provider  cyclobenzaprine (FLEXERIL) 10 MG tablet Take 1 tablet (10 mg total) by mouth 2 (two) times daily as needed for muscle spasms. 03/06/19   Moshe Cipro, NP  ibuprofen (ADVIL) 800 MG tablet Take 1 tablet (800 mg total) by mouth every 8 (eight) hours as needed for moderate pain. 03/06/19   Moshe Cipro, NP    Allergies    Shrimp flavor [flavoring agent]  Review of Systems   Review of Systems  HENT: Positive for facial  swelling and trouble swallowing.   Respiratory: Positive for shortness of breath.   Gastrointestinal: Positive for nausea. Negative for abdominal pain and vomiting.  Skin: Positive for rash.  Neurological: Negative for dizziness and syncope.  All other systems reviewed and are negative.   Physical Exam Updated Vital Signs BP (!) 157/100 (BP Location: Right Arm)   Pulse 65   Temp 97.6 F (36.4 C) (Oral)   Resp 20   SpO2 100%   Physical Exam Vitals and nursing note reviewed.  Constitutional:      General: He is not in acute distress.    Appearance: He is well-developed. He is not toxic-appearing.  HENT:     Head: Normocephalic and atraumatic.     Mouth/Throat:     Pharynx: Uvula midline. No uvula swelling.     Comments: Mild lower lip swelling. No other obvious angioedema.  Posterior oropharynx is symmetric appearing. Patient tolerating own secretions without difficulty. No trismus. No drooling. No hot potato voice. No swelling beneath the tongue, submandibular compartment is soft.  Eyes:     General:        Right eye: No discharge.        Left eye: No discharge.     Conjunctiva/sclera: Conjunctivae normal.  Cardiovascular:     Rate and Rhythm: Normal rate and regular rhythm.  Pulmonary:     Effort: Pulmonary effort is normal. No respiratory distress.     Breath sounds: Normal breath sounds. No stridor. No wheezing, rhonchi or rales.  Abdominal:     General: There is no distension.     Palpations: Abdomen is soft.     Tenderness: There is no abdominal tenderness.  Musculoskeletal:     Cervical back: Neck supple.  Skin:    General: Skin is warm and dry.     Findings: Rash present. Rash is urticarial.  Neurological:     Mental Status: He is alert.     Comments: Clear speech.   Psychiatric:        Behavior: Behavior normal.     ED Results / Procedures / Treatments   Labs (all labs ordered are listed, but only abnormal results are displayed) Labs Reviewed - No data  to display  EKG None  Radiology No results found.  Procedures .Critical Care Performed by: Cherly Anderson, PA-C Authorized by: Cherly Anderson, PA-C     CRITICAL CARE Performed by: Harvie Heck   Total critical care time: 35 minutes  Critical care time was exclusive of separately billable procedures and treating other patients.  Critical care was necessary to treat or prevent imminent or life-threatening deterioration.  Critical care was time spent personally by me on the following activities: development of treatment plan with patient and/or surrogate as well as nursing, discussions with consultants, evaluation of patient's response to treatment, examination of patient, obtaining history from patient or surrogate, ordering and performing treatments and interventions, ordering and review of laboratory studies, ordering and review of radiographic studies, pulse oximetry and re-evaluation of patient's condition. (including critical care time)  Medications Ordered in ED Medications  EPINEPHrine (EPI-PEN) injection 0.3 mg (has no administration in time range)  methylPREDNISolone sodium succinate (SOLU-MEDROL) 125 mg/2 mL injection 125 mg (has no administration in time range)  famotidine (PEPCID) IVPB 20 mg premix (has no administration in time range)    ED Course  I have reviewed the triage vital signs and the nursing notes.  Pertinent labs & imaging results that were available during my care of the patient were reviewed by me and considered in my medical decision making (see chart for details).    MDM Rules/Calculators/A&P                         Patient presents to the ED with complaints of allergic reaction.  I was called to triage to assess the patient, he does not have any visible oropharyngeal edema and is tolerating his own secretions that difficulty with a patent airway,  based on his additional history and physical exam findings we will proceed  with epinephrine as well as IV Pepcid and decadron. Patient will be moved to acute care bed and placed on the monitor.   ED Course:   04:00: RE-EVAL: Patient feeling improved, woken from sleep for assessment.  06:07: RE-EVAL: Patient resting comfortably, feels much better, no complaints at this time, no signs of angioedema or airway compromise.   Will continue to observe post epinephrine. If continues to feel well with no change in clinical status plan is for discharge.   06:30: Patient care signed out to PA Harris @ change of shift pending re-assessment & likely discharge home.   Findings and plan of care discussed with supervising physician Dr. Bebe Shaggy who is in agreement.   Portions of this note were generated with Dragon  dictation software. Dictation errors may occur despite best attempts at proofreading.  Final Clinical Impression(s) / ED Diagnoses Final diagnoses:  Allergic reaction, initial encounter    Rx / DC Orders ED Discharge Orders         Ordered    EPINEPHrine 0.3 mg/0.3 mL IJ SOAJ injection  As needed        01/24/20 0400    diphenhydrAMINE (BENADRYL) 25 MG tablet  Every 6 hours PRN        01/24/20 0400    famotidine (PEPCID) 20 MG tablet  2 times daily PRN        01/24/20 0400           Corliss Lamartina, Pleas Koch, PA-C 01/24/20 [id number]    Zadie Rhine, MD 01/24/20 [phone]

## 2020-01-24 NOTE — ED Notes (Signed)
Pt states improvement in breathing and hives. Denies itching at this time and states breathing feels more normal.  Updated on plan of care  Call light within reach

## 2020-01-24 NOTE — ED Provider Notes (Signed)
Patient taken in sign out from PA Petrucelli Here after allergic rxn to shellfish Patient currently doing well without cough, sob, hives. Sxs all resolved. Patient will be discharged with meds (see avs list) Discussed avoidance of shellfish    Arthor Captain, PA-C 01/24/20 4540    Derwood Kaplan, MD 01/24/20 [phone]

## 2020-03-23 ENCOUNTER — Encounter (HOSPITAL_COMMUNITY): Payer: Self-pay

## 2020-03-23 ENCOUNTER — Ambulatory Visit (INDEPENDENT_AMBULATORY_CARE_PROVIDER_SITE_OTHER): Payer: Self-pay

## 2020-03-23 ENCOUNTER — Other Ambulatory Visit: Payer: Self-pay

## 2020-03-23 ENCOUNTER — Ambulatory Visit (HOSPITAL_COMMUNITY)
Admission: EM | Admit: 2020-03-23 | Discharge: 2020-03-23 | Disposition: A | Discharge status: Home / Self Care | Payer: Self-pay | Attending: Emergency Medicine | Admitting: Emergency Medicine

## 2020-03-23 DIAGNOSIS — M25561 Pain in right knee: Secondary | ICD-10-CM

## 2020-03-23 DIAGNOSIS — M25461 Effusion, right knee: Secondary | ICD-10-CM

## 2020-03-23 DIAGNOSIS — R030 Elevated blood-pressure reading, without diagnosis of hypertension: Secondary | ICD-10-CM

## 2020-03-23 MED ORDER — IBUPROFEN 800 MG PO TABS: 800.0000 mg | ORAL_TABLET | Freq: Three times a day (TID) | ORAL | 0 refills | Status: DC | PRN

## 2020-03-23 NOTE — Discharge Instructions (Addendum)
Take the ibuprofen as prescribed.  Rest and elevate your knee.  Apply ice packs 2-3 times a day for up to 20 minutes each.  Wear the knee sleeve and use the crutches as needed for comfort.  Follow up with an orthopedist if your symptoms are not improving.    Your blood pressure is elevated today at 146/104.  Please have this rechecked by your primary care provider in 1-2 weeks.
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
## 2020-06-26 ENCOUNTER — Other Ambulatory Visit: Payer: Self-pay

## 2020-06-26 ENCOUNTER — Encounter (HOSPITAL_COMMUNITY): Payer: Self-pay | Admitting: *Deleted

## 2020-06-26 ENCOUNTER — Emergency Department (HOSPITAL_COMMUNITY): Payer: Self-pay

## 2020-06-26 ENCOUNTER — Emergency Department (HOSPITAL_COMMUNITY)
Admission: EM | Admit: 2020-06-26 | Discharge: 2020-06-27 | Disposition: A | Discharge status: Home / Self Care | Payer: Self-pay | Attending: Emergency Medicine | Admitting: Emergency Medicine

## 2020-06-26 DIAGNOSIS — M791 Myalgia, unspecified site: Secondary | ICD-10-CM | POA: Insufficient documentation

## 2020-06-26 DIAGNOSIS — R519 Headache, unspecified: Secondary | ICD-10-CM | POA: Insufficient documentation

## 2020-06-26 DIAGNOSIS — R6883 Chills (without fever): Secondary | ICD-10-CM | POA: Insufficient documentation

## 2020-06-26 DIAGNOSIS — Z5321 Procedure and treatment not carried out due to patient leaving prior to being seen by health care provider: Secondary | ICD-10-CM | POA: Insufficient documentation

## 2020-06-26 LAB — CBC
HCT: 47.3 % (ref 39.0–52.0)
Hemoglobin: 15.7 g/dL (ref 13.0–17.0)
MCH: 27.6 pg (ref 26.0–34.0)
MCHC: 33.2 g/dL (ref 30.0–36.0)
MCV: 83.3 fL (ref 80.0–100.0)
Platelets: 262 10*3/uL (ref 150–400)
RBC: 5.68 MIL/uL (ref 4.22–5.81)
RDW: 13.4 % (ref 11.5–15.5)
WBC: 3.2 10*3/uL — ABNORMAL LOW (ref 4.0–10.5)
nRBC: 0 % (ref 0.0–0.2)

## 2020-06-26 LAB — COMPREHENSIVE METABOLIC PANEL
ALT: 29 U/L (ref 0–44)
AST: 22 U/L (ref 15–41)
Albumin: 3.8 g/dL (ref 3.5–5.0)
Alkaline Phosphatase: 65 U/L (ref 38–126)
Anion gap: 8 (ref 5–15)
BUN: 12 mg/dL (ref 6–20)
CO2: 25 mmol/L (ref 22–32)
Calcium: 8.7 mg/dL — ABNORMAL LOW (ref 8.9–10.3)
Chloride: 106 mmol/L (ref 98–111)
Creatinine, Ser: 1.4 mg/dL — ABNORMAL HIGH (ref 0.61–1.24)
GFR, Estimated: >60 mL/min (ref >60)
Glucose, Bld: 105 mg/dL — ABNORMAL HIGH (ref 70–99)
Potassium: 4.4 mmol/L (ref 3.5–5.1)
Sodium: 139 mmol/L (ref 135–145)
Total Bilirubin: 0.6 mg/dL (ref 0.3–1.2)
Total Protein: 6.8 g/dL (ref 6.5–8.1)

## 2020-06-26 LAB — LIPASE, BLOOD: Lipase: 61 U/L — ABNORMAL HIGH (ref 11–51)

## 2020-06-26 NOTE — ED Triage Notes (Signed)
Productive cough also

## 2020-06-26 NOTE — ED Triage Notes (Signed)
Body aches temp for 3-4 days  headache chills   no temp today

## 2020-06-27 NOTE — ED Notes (Signed)
Pt left due to not being seen quick enough 

## 2021-02-04 ENCOUNTER — Other Ambulatory Visit: Payer: Self-pay

## 2021-02-04 ENCOUNTER — Encounter (HOSPITAL_BASED_OUTPATIENT_CLINIC_OR_DEPARTMENT_OTHER): Payer: Self-pay | Admitting: Emergency Medicine

## 2021-02-04 DIAGNOSIS — M79662 Pain in left lower leg: Secondary | ICD-10-CM | POA: Insufficient documentation

## 2021-02-04 DIAGNOSIS — M79652 Pain in left thigh: Secondary | ICD-10-CM | POA: Insufficient documentation

## 2021-02-04 NOTE — ED Triage Notes (Signed)
Pt presents for L leg pain since Monday, has become progressively worse. Starts in left leg and runs down dorsal aspect of entire leg. Occupation is a Naval architect. Notes pain is worse when straitening leg.   Denies change in sensation, loss of bowel or bladder

## 2021-02-05 ENCOUNTER — Emergency Department (HOSPITAL_BASED_OUTPATIENT_CLINIC_OR_DEPARTMENT_OTHER)
Admission: EM | Admit: 2021-02-05 | Discharge: 2021-02-05 | Disposition: A | Discharge status: Home / Self Care | Payer: Self-pay | Attending: Emergency Medicine | Admitting: Emergency Medicine

## 2021-02-05 ENCOUNTER — Emergency Department (HOSPITAL_BASED_OUTPATIENT_CLINIC_OR_DEPARTMENT_OTHER): Payer: Self-pay

## 2021-02-05 DIAGNOSIS — M79605 Pain in left leg: Secondary | ICD-10-CM

## 2021-02-05 MED ORDER — OXYCODONE-ACETAMINOPHEN 5-325 MG PO TABS
1.0000 | ORAL_TABLET | Freq: Once | ORAL | Status: AC
  Administered 2021-02-05: 1 via ORAL
  Filled 2021-02-05: qty 1

## 2021-02-05 MED ORDER — NAPROXEN 500 MG PO TABS: 500.0000 mg | ORAL_TABLET | Freq: Two times a day (BID) | ORAL | 0 refills | Status: DC | PRN

## 2021-02-05 MED ORDER — CYCLOBENZAPRINE HCL 10 MG PO TABS: 10.0000 mg | ORAL_TABLET | Freq: Two times a day (BID) | ORAL | 0 refills | Status: DC | PRN

## 2021-02-05 NOTE — ED Provider Notes (Signed)
[place] EMERGENCY DEPT Provider Note   CSN: [id number] Arrival date & time: 02/04/21  2222     History  Chief Complaint  Patient presents with   Leg Pain    Edwin Cline is a 46 y.o. male.  Presented to the emergency room with concern for left leg pain.  Patient states pain is primarily in his left thigh.  At times shoots down the back of his leg to his lower leg.  Currently no pain in his lower leg.  No numbness or weakness.  Has been able to walk.  No back pain.  No bladder or bowel incontinence.  No known injuries.  Works as a Administrator.  No prior history of DVT/PE.  Denies any chronic medical problems.  Has not tried any over-the-counter medicines today.  Pain is currently moderate to severe, aching.  Worse with movement and improved with rest.  Not associated with any rashes. HPI     Home Medications Prior to Admission medications   Medication Sig Start Date End Date Taking? Authorizing Provider  naproxen (NAPROSYN) 500 MG tablet Take 1 tablet (500 mg total) by mouth 2 (two) times daily as needed. 02/05/21  Yes Lucrezia Starch, MD  cyclobenzaprine (FLEXERIL) 10 MG tablet Take 1 tablet (10 mg total) by mouth 2 (two) times daily as needed for muscle spasms. 02/05/21   Lucrezia Starch, MD  diphenhydrAMINE (BENADRYL) 25 MG tablet Take 1 tablet (25 mg total) by mouth every 6 (six) hours as needed for itching (hives). 01/24/20   Petrucelli, Samantha R, PA-C  EPINEPHrine 0.3 mg/0.3 mL IJ SOAJ injection Inject 0.3 mg into the muscle as needed for anaphylaxis. 01/24/20   Petrucelli, Samantha R, PA-C  famotidine (PEPCID) 20 MG tablet Take 1 tablet (20 mg total) by mouth 2 (two) times daily as needed (hives/itchiness). 01/24/20   Petrucelli, Samantha R, PA-C  ibuprofen (ADVIL) 800 MG tablet Take 1 tablet (800 mg total) by mouth every 8 (eight) hours as needed. 03/23/20   Sharion Balloon, NP      Allergies    Shrimp flavor [flavoring agent]    Review of Systems   Review  of Systems  Musculoskeletal:  Positive for arthralgias.  All other systems reviewed and are negative.  Physical Exam Updated Vital Signs BP (!) 138/100    Pulse 66    Temp 98.5 F (36.9 C) (Oral)    Resp 16    Wt 99.8 kg    SpO2 97%    BMI 27.50 kg/m  Physical Exam Vitals and nursing note reviewed.  Constitutional:      General: He is not in acute distress.    Appearance: He is well-developed.  HENT:     Head: Normocephalic and atraumatic.  Eyes:     Conjunctiva/sclera: Conjunctivae normal.  Cardiovascular:     Rate and Rhythm: Normal rate and regular rhythm.     Heart sounds: No murmur heard. Pulmonary:     Effort: Pulmonary effort is normal. No respiratory distress.  Musculoskeletal:     Cervical back: Neck supple.     Comments: No tenderness to palpation over T or L-spine Left lower extremity: There is some tenderness to the posterior left thigh, compartments are soft, there is no significant swelling noted in the extremity, no rash, normal DP/PT pulse, sensation intact, motor intact  Skin:    General: Skin is warm and dry.     Capillary Refill: Capillary refill takes less than 2 seconds.  Neurological:  General: No focal deficit present.     Mental Status: He is alert.  Psychiatric:        Mood and Affect: Mood normal.    ED Results / Procedures / Treatments   Labs (all labs ordered are listed, but only abnormal results are displayed) Labs Reviewed - No data to display  EKG None  Radiology US Venous Img Lower Unilateral Left  Result Date: 02/05/2021 CLINICAL DATA:  Left lower extremity pain.  Concern for DVT. EXAM: Left LOWER EXTREMITY VENOUS DOPPLER ULTRASOUND TECHNIQUE: Gray-scale sonography with compression, as well as color and duplex ultrasound, were performed to evaluate the deep venous system(s) from the level of the common femoral vein through the popliteal and proximal calf veins. COMPARISON:  None. FINDINGS: VENOUS Normal compressibility of the common  femoral, superficial femoral, and popliteal veins, as well as the visualized calf veins. Visualized portions of profunda femoral vein and great saphenous vein unremarkable. No filling defects to suggest DVT on grayscale or color Doppler imaging. Doppler waveforms show normal direction of venous flow, normal respiratory plasticity and response to augmentation. Limited views of the contralateral common femoral vein are unremarkable. OTHER None. Limitations: none IMPRESSION: Negative. Electronically Signed   By: Anner Crete M.D.   On: 02/05/2021 03:15   DG Femur Min 2 Views Left  Result Date: 02/05/2021 CLINICAL DATA:  Left lower extremity pain. EXAM: LEFT FEMUR 2 VIEWS COMPARISON:  Left knee radiograph dated 08/07/2016. FINDINGS: There is no evidence of fracture or other focal bone lesions. Soft tissues are unremarkable. IMPRESSION: Negative. Electronically Signed   By: Anner Crete M.D.   On: 02/05/2021 02:53    Procedures Procedures    Medications Ordered in ED Medications  oxyCODONE-acetaminophen (PERCOCET/ROXICET) 5-325 MG per tablet 1 tablet (1 tablet Oral Given 02/05/21 0247)    ED Course/ Medical Decision Making/ A&P                           Medical Decision Making Amount and/or Complexity of Data Reviewed Radiology: ordered. ECG/medicine tests: ordered.  Risk Prescription drug management.   46 year old male presented to ER with concern for left leg pain.  On exam he appears well in no distress, leg is neurovascularly intact.  Some tenderness noted to the left thigh.  Occupation is truck Geophysicist/field seismologist, raises concern for possibility of DVT.  DVT study was obtained and negative for acute pathology.  Screening x-ray negative for fracture, dislocation.  Independently reviewed x-ray imaging and agree with radiology.  No swelling, erythema, induration, fever to suggest abscess, do not feel CT or MRI imaging or laboratory work needed at this time.  Compartments soft, exam and history not  consistent with compartment syndrome.  Suspect most likely MSK strain.  Recommend trial of anti-inflammatories and follow-up with primary care doctor.  Reviewed return precautions and discharged home.  Significant other at bedside, discussed results with her as well.        Final Clinical Impression(s) / ED Diagnoses Final diagnoses:  Pain of left lower extremity    Rx / DC Orders ED Discharge Orders          Ordered    naproxen (NAPROSYN) 500 MG tablet  2 times daily PRN        02/05/21 0405    cyclobenzaprine (FLEXERIL) 10 MG tablet  2 times daily PRN        02/05/21 0405  Lucrezia Starch, MD 02/06/21 [phone]

## 2021-02-05 NOTE — Discharge Instructions (Signed)
Recommend following up with primary care doctor or with a sports medicine doctor for recheck and further discussion of your leg pain.  Recommend taking anti-inflammatories such as the prescribed naproxen.  Additionally can take Tylenol as needed.  Can also take muscle relaxer as prescribed as needed.  Note this can make you slightly drowsy and should not be taken while driving or operating heavy machinery.  If you feel like your pain is increasing in severity, develop worsening swelling, redness, fever, numbness or weakness, come back to ER for reassessment.

## 2021-02-06 ENCOUNTER — Emergency Department (HOSPITAL_BASED_OUTPATIENT_CLINIC_OR_DEPARTMENT_OTHER): Payer: Self-pay

## 2021-02-06 ENCOUNTER — Encounter (HOSPITAL_BASED_OUTPATIENT_CLINIC_OR_DEPARTMENT_OTHER): Payer: Self-pay

## 2021-02-06 ENCOUNTER — Other Ambulatory Visit: Payer: Self-pay

## 2021-02-06 ENCOUNTER — Emergency Department (HOSPITAL_BASED_OUTPATIENT_CLINIC_OR_DEPARTMENT_OTHER)
Admission: EM | Admit: 2021-02-06 | Discharge: 2021-02-06 | Disposition: A | Discharge status: Home / Self Care | Payer: Self-pay | Attending: Emergency Medicine | Admitting: Emergency Medicine

## 2021-02-06 DIAGNOSIS — M5416 Radiculopathy, lumbar region: Secondary | ICD-10-CM | POA: Insufficient documentation

## 2021-02-06 MED ORDER — LIDOCAINE 5 % EX PTCH
1.0000 | MEDICATED_PATCH | CUTANEOUS | Status: DC
  Administered 2021-02-06: 1 via TRANSDERMAL
  Filled 2021-02-06: qty 1

## 2021-02-06 MED ORDER — METHOCARBAMOL 500 MG PO TABS
500.0000 mg | ORAL_TABLET | Freq: Once | ORAL | Status: AC
  Administered 2021-02-06: 500 mg via ORAL
  Filled 2021-02-06: qty 1

## 2021-02-06 MED ORDER — HYDROCODONE-ACETAMINOPHEN 5-325 MG PO TABS: 1.0000 | ORAL_TABLET | Freq: Four times a day (QID) | ORAL | 0 refills | Status: DC | PRN

## 2021-02-06 MED ORDER — DEXAMETHASONE SODIUM PHOSPHATE 10 MG/ML IJ SOLN
10.0000 mg | Freq: Once | INTRAMUSCULAR | Status: AC
  Administered 2021-02-06: 10 mg via INTRAVENOUS
  Filled 2021-02-06: qty 1

## 2021-02-06 MED ORDER — FENTANYL CITRATE PF 50 MCG/ML IJ SOSY
50.0000 ug | PREFILLED_SYRINGE | Freq: Once | INTRAMUSCULAR | Status: AC
  Administered 2021-02-06: 50 ug via INTRAVENOUS
  Filled 2021-02-06: qty 1

## 2021-02-06 MED ORDER — HYDROMORPHONE HCL 1 MG/ML IJ SOLN
1.0000 mg | Freq: Once | INTRAMUSCULAR | Status: AC
  Administered 2021-02-06: 1 mg via INTRAVENOUS
  Filled 2021-02-06: qty 1

## 2021-02-06 NOTE — ED Provider Notes (Signed)
[facility] EMERGENCY DEPT Provider Note   CSN: [id number] Arrival date & time: 02/06/21  1608     History  Chief Complaint  Patient presents with   Back Pain   Sciatica    Edwin Cline is a 46 y.o. male who presents to the ED due to left low back pain that radiates down left lower extremity which has been present for the past few days.  Patient seen in the ED yesterday where an ultrasound and x-rays were obtained which were negative for any acute abnormalities. No DVT. No bony fractures. Patient endorses subjective numbness/tingling and weakness.  Denies saddle paresthesias.  No bowel/bladder incontinence.  Denies recent injury.  Patient had remote lumbar spine surgery numerous years ago.  No IV drug use.  Denies fever and chills.  Denies associated abdominal pain.  Patient was discharged with naproxen and Flexeril yesterday which he has been compliant with with no relief in symptoms.     Home Medications Prior to Admission medications   Medication Sig Start Date End Date Taking? Authorizing Provider  HYDROcodone-acetaminophen (NORCO/VICODIN) 5-325 MG tablet Take 1 tablet by mouth every 6 (six) hours as needed. 02/06/21  Yes Qusay Villada, Druscilla Brownie, PA-C  cyclobenzaprine (FLEXERIL) 10 MG tablet Take 1 tablet (10 mg total) by mouth 2 (two) times daily as needed for muscle spasms. 02/05/21   Lucrezia Starch, MD  diphenhydrAMINE (BENADRYL) 25 MG tablet Take 1 tablet (25 mg total) by mouth every 6 (six) hours as needed for itching (hives). 01/24/20   Petrucelli, Samantha R, PA-C  EPINEPHrine 0.3 mg/0.3 mL IJ SOAJ injection Inject 0.3 mg into the muscle as needed for anaphylaxis. 01/24/20   Petrucelli, Samantha R, PA-C  famotidine (PEPCID) 20 MG tablet Take 1 tablet (20 mg total) by mouth 2 (two) times daily as needed (hives/itchiness). 01/24/20   Petrucelli, Samantha R, PA-C  ibuprofen (ADVIL) 800 MG tablet Take 1 tablet (800 mg total) by mouth every 8 (eight) hours as needed. 03/23/20    Sharion Balloon, NP  naproxen (NAPROSYN) 500 MG tablet Take 1 tablet (500 mg total) by mouth 2 (two) times daily as needed. 02/05/21   Lucrezia Starch, MD      Allergies    Shrimp flavor [flavoring agent]    Review of Systems   Review of Systems  Constitutional:  Negative for chills and fever.  Gastrointestinal:  Negative for abdominal pain.  Musculoskeletal:  Positive for back pain.   Physical Exam Updated Vital Signs BP (!) 143/104    Pulse 69    Temp 98.4 F (36.9 C)    Resp 16    Ht 6\' 3"  (1.905 m)    Wt 99.8 kg    SpO2 98%    BMI 27.50 kg/m  Physical Exam Vitals and nursing note reviewed.  Constitutional:      General: He is not in acute distress.    Appearance: He is not ill-appearing.  HENT:     Head: Normocephalic.  Eyes:     Pupils: Pupils are equal, round, and reactive to light.  Cardiovascular:     Rate and Rhythm: Normal rate and regular rhythm.     Pulses: Normal pulses.     Heart sounds: Normal heart sounds. No murmur heard.   No friction rub. No gallop.  Pulmonary:     Effort: Pulmonary effort is normal.     Breath sounds: Normal breath sounds.  Abdominal:     General: Abdomen is flat. There is no distension.  Palpations: Abdomen is soft.     Tenderness: There is no abdominal tenderness. There is no guarding or rebound.  Musculoskeletal:        General: Normal range of motion.     Cervical back: Neck supple.     Comments: Reproducible left sided lumbar paraspinal tenderness. Bilateral lower extremities neurovascularly intact  Skin:    General: Skin is warm and dry.  Neurological:     General: No focal deficit present.     Mental Status: He is alert.  Psychiatric:        Mood and Affect: Mood normal.        Behavior: Behavior normal.    ED Results / Procedures / Treatments   Labs (all labs ordered are listed, but only abnormal results are displayed) Labs Reviewed - No data to display  EKG None  Radiology CT Lumbar Spine Wo  Contrast  Result Date: 02/06/2021 CLINICAL DATA:  Low back pain EXAM: CT LUMBAR SPINE WITHOUT CONTRAST TECHNIQUE: Multidetector CT imaging of the lumbar spine was performed without intravenous contrast administration. Multiplanar CT image reconstructions were also generated. RADIATION DOSE REDUCTION: This exam was performed according to the departmental dose-optimization program which includes automated exposure control, adjustment of the mA and/or kV according to patient size and/or use of iterative reconstruction technique. COMPARISON:  Radiograph 01/24/2010 FINDINGS: Segmentation: 5 lumbar type vertebrae. Alignment: Normal. Vertebrae: No acute fracture or focal pathologic process. Paraspinal and other soft tissues: Negative. Disc levels: At T12-L1, no canal stenosis or foraminal narrowing. At L1-L2, maintained disc space. No canal stenosis. No foraminal narrowing. At L2-L3, maintained disc space. No canal stenosis. No foraminal narrowing. At L3-L4, maintained disc space. No canal stenosis. No foraminal narrowing. At L4-L5, mild disc space narrowing. Mild diffuse disc bulge. No canal stenosis. Mild facet degenerative changes. No foraminal narrowing. At L5-S1, mild disc space narrowing. Possible small focal left paracentral/subarticular disc protrusion. No canal stenosis. No bony foraminal narrowing. IMPRESSION: 1. No acute osseous abnormality. 2. Degenerative changes at L4-L5 and L5-S1. There is possible small focal left paracentral/subarticular disc protrusion at L5-S1. This would be better evaluated with MRI. Electronically Signed   By: Donavan Foil M.D.   On: 02/06/2021 21:48   US Venous Img Lower Unilateral Left  Result Date: 02/05/2021 CLINICAL DATA:  Left lower extremity pain.  Concern for DVT. EXAM: Left LOWER EXTREMITY VENOUS DOPPLER ULTRASOUND TECHNIQUE: Gray-scale sonography with compression, as well as color and duplex ultrasound, were performed to evaluate the deep venous system(s) from the  level of the common femoral vein through the popliteal and proximal calf veins. COMPARISON:  None. FINDINGS: VENOUS Normal compressibility of the common femoral, superficial femoral, and popliteal veins, as well as the visualized calf veins. Visualized portions of profunda femoral vein and great saphenous vein unremarkable. No filling defects to suggest DVT on grayscale or color Doppler imaging. Doppler waveforms show normal direction of venous flow, normal respiratory plasticity and response to augmentation. Limited views of the contralateral common femoral vein are unremarkable. OTHER None. Limitations: none IMPRESSION: Negative. Electronically Signed   By: Anner Crete M.D.   On: 02/05/2021 03:15   DG Femur Min 2 Views Left  Result Date: 02/05/2021 CLINICAL DATA:  Left lower extremity pain. EXAM: LEFT FEMUR 2 VIEWS COMPARISON:  Left knee radiograph dated 08/07/2016. FINDINGS: There is no evidence of fracture or other focal bone lesions. Soft tissues are unremarkable. IMPRESSION: Negative. Electronically Signed   By: Anner Crete M.D.   On: 02/05/2021 02:53  Procedures Procedures    Medications Ordered in ED Medications  lidocaine (LIDODERM) 5 % 1 patch (1 patch Transdermal Patch Applied 02/06/21 2119)  fentaNYL (SUBLIMAZE) injection 50 mcg (50 mcg Intravenous Given 02/06/21 2009)  dexamethasone (DECADRON) injection 10 mg (10 mg Intravenous Given 02/06/21 2007)  HYDROmorphone (DILAUDID) injection 1 mg (1 mg Intravenous Given 02/06/21 2122)  methocarbamol (ROBAXIN) tablet 500 mg (500 mg Oral Given 02/06/21 2123)    ED Course/ Medical Decision Making/ A&P                           Medical Decision Making Amount and/or Complexity of Data Reviewed Independent Historian: spouse Radiology: ordered and independent interpretation performed. Decision-making details documented in ED Course.  Risk OTC drugs. Prescription drug management.  46 year old male presents to the ED due to left low  back pain that radiates down left lower extremity.  Patient evaluated in the ED yesterday for the same complaint where an ultrasound and x-rays were obtained.  Ultrasound negative for DVT.  X-rays negative for bony fractures.  Patient has a history of sciatica.  Had a remote lumbar spine surgery numerous years ago.  Patient denies saddle paresthesias.  No bowel/bladder incontinence.  Upon arrival, stable vitals.  Patient in no acute distress.  Physical exam significant for reproducible left-sided lumbar paraspinal tenderness.  Bilateral lower extremities neurovascularly intact with soft compartments.  No edema.  Low suspicion for cauda equina or central cord compression.  Suspect lumbar radiculopathy.  No infectious symptoms to suggest infectious etiology.  Fentanyl and Decadron given.  Will reassess after pain management and if symptoms continue will obtain CT lumbar spine to rule out any significant abnormalities.  9:07 PM Patient attempted to ambulate after pain medication and muscle relaxer; however pain was too severe. CT lumbar spine ordered. Dilaudid given.   9:54 PM Reassessed patient who notes improvement in symptoms. Patient able to ambulate in the ED. CT lumbar spine personally reviewed and interpreted which demonstrated small disc protrusion at L5-S1 which is likely causing patient's radicular symptoms. Agree with radiology interpretation. Hospitalization not warranted at this time given low suspicion for cauda equina or central cord compression.  Patient discharged with pain medication.  Advised patient to follow-up with his neurosurgeon within the next week for further evaluation. Strict ED precautions discussed with patient. Patient states understanding and agrees to plan. Patient discharged home in no acute distress and stable vitals        Final Clinical Impression(s) / ED Diagnoses Final diagnoses:  Lumbar radiculopathy    Rx / DC Orders ED Discharge Orders          Ordered     HYDROcodone-acetaminophen (NORCO/VICODIN) 5-325 MG tablet  Every 6 hours PRN        02/06/21 2157              Karie Kirks 02/06/21 2159    Lacretia Leigh, MD 02/07/21 [phone]

## 2021-02-06 NOTE — Discharge Instructions (Signed)
It was a pleasure taking care of you today. As discussed, your CT scan showed a small disc protrusion. I am sending you home with pain medication. Take as needed for severe pain. Continue to take your muscle relaxer as needed. Follow-up with your neurosurgeon within the next few days for further evaluation. Return to the ER for new or worsening symptoms.

## 2021-02-06 NOTE — ED Triage Notes (Signed)
Pt c/o left lower back pain that shoots down his left leg. Hx of sciatica. Pt was seen yesterday for the same.

## 2021-02-06 NOTE — ED Notes (Signed)
Pt verbalizes understanding of discharge instructions. Opportunity for questioning and answers were provided. Pt discharged from ED to home.   ? ?

## 2021-02-06 NOTE — ED Notes (Signed)
Pt ambulated per PA. Pt stated that pain increased from a 5/10 while lying down to a 10/10 after taking a few steps.

## 2021-02-08 ENCOUNTER — Ambulatory Visit: Payer: Self-pay | Admitting: Orthopaedic Surgery

## 2021-02-15 ENCOUNTER — Encounter: Payer: Self-pay | Admitting: Family Medicine

## 2021-02-15 ENCOUNTER — Other Ambulatory Visit (HOSPITAL_BASED_OUTPATIENT_CLINIC_OR_DEPARTMENT_OTHER): Payer: Self-pay

## 2021-02-15 ENCOUNTER — Ambulatory Visit (INDEPENDENT_AMBULATORY_CARE_PROVIDER_SITE_OTHER): Payer: 59 | Admitting: Family Medicine

## 2021-02-15 VITALS — BP 140/98 | Ht 75.0 in | Wt 220.0 lb

## 2021-02-15 DIAGNOSIS — M5416 Radiculopathy, lumbar region: Secondary | ICD-10-CM | POA: Insufficient documentation

## 2021-02-15 MED ORDER — HYDROCODONE-ACETAMINOPHEN 5-325 MG PO TABS
1.0000 | ORAL_TABLET | Freq: Three times a day (TID) | ORAL | 0 refills | Status: DC | PRN
  Filled 2021-02-15: qty 15, 5d supply, fill #0

## 2021-02-15 MED ORDER — METHYLPREDNISOLONE ACETATE 40 MG/ML IJ SUSP
40.0000 mg | Freq: Once | INTRAMUSCULAR | Status: AC
  Administered 2021-02-15: 40 mg via INTRAMUSCULAR

## 2021-02-15 MED ORDER — MELOXICAM 7.5 MG PO TABS: 7.5000 mg | ORAL_TABLET | Freq: Two times a day (BID) | ORAL | 1 refills | Status: DC | PRN

## 2021-02-15 MED ORDER — KETOROLAC TROMETHAMINE 30 MG/ML IJ SOLN
30.0000 mg | Freq: Once | INTRAMUSCULAR | Status: AC
  Administered 2021-02-15: 30 mg via INTRAMUSCULAR

## 2021-02-15 MED ORDER — MELOXICAM 7.5 MG PO TABS
7.5000 mg | ORAL_TABLET | Freq: Two times a day (BID) | ORAL | 1 refills | Status: DC | PRN
  Filled 2021-02-15: qty 45, 23d supply, fill #0

## 2021-02-15 MED ORDER — HYDROCODONE-ACETAMINOPHEN 5-325 MG PO TABS: 1.0000 | ORAL_TABLET | Freq: Three times a day (TID) | ORAL | 0 refills | Status: DC | PRN

## 2021-02-15 NOTE — Patient Instructions (Signed)
Nice to meet you Please try heat  Please try the stretches  Please use the pain medicine as needed   Please send me a message in MyChart with any questions or updates.  Please see me back in 1-2 weeks.   --Dr. Jordan Likes

## 2021-02-15 NOTE — Assessment & Plan Note (Signed)
Acutely occurring.  Pain most consistent with nerve impingement. -Counseled on home exercise therapy and supportive care. -IM Depo-Medrol and Toradol. -Mobic. -Norco. -Could consider physical therapy or further imaging.

## 2021-02-15 NOTE — Progress Notes (Signed)
°  Edwin Cline - 46 y.o. male MRN [id number]  Date of birth: 09-Aug-1975  SUBJECTIVE:  Including CC & ROS.  No chief complaint on file.   Edwin Cline is a 46 y.o. male that is presenting with acute left leg pain.  The pain is occurring over the left lateral aspect.  It is severe in nature.  No history of surgery.  Is worse with any form of ambulation and sitting upright.  Review of the note from 1/22 shows that he was provided naproxen and Flexeril. Review of the note from 1/23 shows she was provided Norco. Independent review of the left femur x-ray from 1/22 shows no acute changes. Review of the lower extremity duplex from 1/22 shows no DVT. Independent review of the CT lumbar spine from 1/23 shows bulging of the disc at L5-S1.  Review of Systems See HPI   HISTORY: Past Medical, Surgical, Social, and Family History Reviewed & Updated per EMR.   Pertinent Historical Findings include:  Past Medical History:  Diagnosis Date   Carpal tunnel syndrome     Past Surgical History:  Procedure Laterality Date   BACK SURGERY     SPINE SURGERY       PHYSICAL EXAM:  VS: BP (!) 140/98 (BP Location: Right Arm, Patient Position: Sitting)    Ht 6\' 3"  (1.905 m)    Wt 220 lb (99.8 kg)    BMI 27.50 kg/m  Physical Exam Gen: NAD, alert, cooperative with exam, well-appearing MSK:  Positive straight leg raise on the left Neurovascularly intact       ASSESSMENT & PLAN:   Lumbar radiculopathy Acutely occurring.  Pain most consistent with nerve impingement. -Counseled on home exercise therapy and supportive care. -IM Depo-Medrol and Toradol. -Mobic. -Norco. -Could consider physical therapy or further imaging.

## 2021-02-20 ENCOUNTER — Encounter (HOSPITAL_BASED_OUTPATIENT_CLINIC_OR_DEPARTMENT_OTHER): Payer: Self-pay | Admitting: *Deleted

## 2021-02-20 ENCOUNTER — Emergency Department (HOSPITAL_BASED_OUTPATIENT_CLINIC_OR_DEPARTMENT_OTHER)
Admission: EM | Admit: 2021-02-20 | Discharge: 2021-02-20 | Disposition: A | Discharge status: Home / Self Care | Payer: 59 | Attending: Emergency Medicine | Admitting: Emergency Medicine

## 2021-02-20 ENCOUNTER — Other Ambulatory Visit: Payer: Self-pay

## 2021-02-20 DIAGNOSIS — M5442 Lumbago with sciatica, left side: Secondary | ICD-10-CM

## 2021-02-20 DIAGNOSIS — M545 Low back pain, unspecified: Secondary | ICD-10-CM | POA: Diagnosis present

## 2021-02-20 MED ORDER — OXYCODONE-ACETAMINOPHEN 10-325 MG PO TABS: 1.0000 | ORAL_TABLET | ORAL | 0 refills | Status: DC | PRN

## 2021-02-20 MED ORDER — LIDOCAINE 5 % EX PTCH: 1.0000 | MEDICATED_PATCH | CUTANEOUS | 0 refills | Status: DC

## 2021-02-20 MED ORDER — HYDROMORPHONE HCL 1 MG/ML IJ SOLN
2.0000 mg | Freq: Once | INTRAMUSCULAR | Status: AC
  Administered 2021-02-20: 2 mg via INTRAMUSCULAR
  Filled 2021-02-20: qty 2

## 2021-02-20 NOTE — ED Provider Notes (Signed)
[facility] EMERGENCY DEPARTMENT Provider Note   CSN: [id number] Arrival date & time: 02/20/21  1737     History  Chief Complaint  Patient presents with   Back Pain    Edwin Cline is a 46 y.o. male.  Patient presents chief complaint of left-sided lower back pain rating down the left leg.  He states symptoms been there for about 3 weeks.  He was seen in the ER initially and he states that he was given 10 mg tablets of her current medication which appeared to work better.  He had seen Dr. Noreene Filbert at his office about a week ago and was prescribed Norco's which he states has not been working as well.  He states that the pain is keeping him up all night and he presents to the ER.  Otherwise denies any new injuries or falls.  Denies any new numbness or weakness.  Denies any new bowel or bladder dysfunction.  No reported fevers or cough.      Home Medications Prior to Admission medications   Medication Sig Start Date End Date Taking? Authorizing Provider  lidocaine (LIDODERM) 5 % Place 1 patch onto the skin daily. Remove & Discard patch within 12 hours or as directed by MD 02/20/21  Yes China, Eustace Moore, MD  oxyCODONE-acetaminophen (PERCOCET) 10-325 MG tablet Take 1 tablet by mouth every 4 (four) hours as needed for up to 10 doses for pain. 02/20/21  Yes Mayer Vondrak, Eustace Moore, MD  cyclobenzaprine (FLEXERIL) 10 MG tablet Take 1 tablet (10 mg total) by mouth 2 (two) times daily as needed for muscle spasms. 02/05/21   Milagros Loll, MD  diphenhydrAMINE (BENADRYL) 25 MG tablet Take 1 tablet (25 mg total) by mouth every 6 (six) hours as needed for itching (hives). 01/24/20   Petrucelli, Samantha R, PA-C  EPINEPHrine 0.3 mg/0.3 mL IJ SOAJ injection Inject 0.3 mg into the muscle as needed for anaphylaxis. 01/24/20   Petrucelli, Samantha R, PA-C  famotidine (PEPCID) 20 MG tablet Take 1 tablet (20 mg total) by mouth 2 (two) times daily as needed (hives/itchiness). 01/24/20   Petrucelli, Samantha R, PA-C   meloxicam (MOBIC) 7.5 MG tablet Take 1 tablet (7.5 mg total) by mouth 2 (two) times daily as needed. 02/15/21   Myra Rude, MD      Allergies    Shrimp flavor [flavoring agent]    Review of Systems   Review of Systems  Constitutional:  Negative for fever.  HENT:  Negative for ear pain and sore throat.   Eyes:  Negative for pain.  Respiratory:  Negative for cough.   Cardiovascular:  Negative for chest pain.  Gastrointestinal:  Negative for abdominal pain.  Genitourinary:  Negative for flank pain.  Musculoskeletal:  Positive for back pain.  Skin:  Negative for color change and rash.  Neurological:  Negative for syncope.  All other systems reviewed and are negative.  Physical Exam Updated Vital Signs BP (!) 168/112 (BP Location: Left Arm)    Pulse (!) 43    Temp 98 F (36.7 C) (Oral)    Resp 18    Ht 6\' 3"  (1.905 m)    Wt 99.8 kg    SpO2 96%    BMI 27.50 kg/m  Physical Exam Constitutional:      Appearance: He is well-developed.  HENT:     Head: Normocephalic.     Nose: Nose normal.  Eyes:     Extraocular Movements: Extraocular movements intact.  Cardiovascular:  Rate and Rhythm: Normal rate.  Pulmonary:     Effort: Pulmonary effort is normal.  Musculoskeletal:     Comments: Bilateral lower extremities neurovascularly intact.  Positive straight leg test in the left lower extremity.  No saddle anesthesia on exam.  Skin:    Coloration: Skin is not jaundiced.  Neurological:     Mental Status: He is alert. Mental status is at baseline.    ED Results / Procedures / Treatments   Labs (all labs ordered are listed, but only abnormal results are displayed) Labs Reviewed - No data to display  EKG None  Radiology No results found.  Procedures Procedures    Medications Ordered in ED Medications  HYDROmorphone (DILAUDID) injection 2 mg (has no administration in time range)    ED Course/ Medical Decision Making/ A&P                           Medical  Decision Making Risk Prescription drug management.   Patient and family asked several times for any new symptoms or progression of any new numbness or weakness which they deny.  No saddle anesthesia reported no difficulty or loss of control of bowel or bladder reported.  They state that the Norco's have not been working as well as the prior medication which they described as having the Percocet.  Patient given injection of Dilaudid here in the ER.  We will give a prescription of Percocet to go home with.  Advised him to follow-up with his sports medicine physician in 1 week.  Recommending immediate return for any new numbness or weakness or bowel or bladder loss of control.        Final Clinical Impression(s) / ED Diagnoses Final diagnoses:  Acute left-sided low back pain with left-sided sciatica    Rx / DC Orders ED Discharge Orders          Ordered    oxyCODONE-acetaminophen (PERCOCET) 10-325 MG tablet  Every 4 hours PRN        02/20/21 1937    lidocaine (LIDODERM) 5 %  Every 24 hours        02/20/21 1938              Cheryll Cockayne, MD 02/20/21 [id number]

## 2021-02-20 NOTE — Discharge Instructions (Signed)
Call your primary care doctor or specialist as discussed in the next 2-3 days.   Return immediately back to the ER if:  Your symptoms worsen within the next 12-24 hours. You develop new symptoms such as new fevers, persistent vomiting, new pain, shortness of breath, or new weakness or numbness, or if you have any other concerns.  

## 2021-02-20 NOTE — ED Notes (Signed)
Pt A&OX4 at d/c, wheeled out of ED via wheelchair. Pt verbalized understanding of d/c instructions, prescriptions and follow up care.

## 2021-02-20 NOTE — ED Triage Notes (Signed)
Back pain with radiation down his left leg. Hx of sciatica.

## 2021-02-21 ENCOUNTER — Ambulatory Visit (INDEPENDENT_AMBULATORY_CARE_PROVIDER_SITE_OTHER): Payer: 59 | Admitting: Family Medicine

## 2021-02-21 ENCOUNTER — Encounter: Payer: Self-pay | Admitting: Family Medicine

## 2021-02-21 VITALS — BP 142/100 | Ht 75.0 in | Wt 220.0 lb

## 2021-02-21 DIAGNOSIS — M5416 Radiculopathy, lumbar region: Secondary | ICD-10-CM

## 2021-02-21 NOTE — Patient Instructions (Signed)
Good to see you I will call with the results.  Please call [phone] to schedule the MRI   Please send me a message in MyChart with any questions or updates.  We'll setup a virtual visit once the MRI is resulted.   --Dr. Jordan Likes

## 2021-02-21 NOTE — Progress Notes (Addendum)
°  Edwin Cline - 46 y.o. male MRN [id number]  Date of birth: 11/17/1975  SUBJECTIVE:  Including CC & ROS.  No chief complaint on file.   Edwin Cline is a 46 y.o. male that is presenting with worsening radicular left leg pain.  The pain is severe in nature.  Has been constant and severe.  He is having night sweats as well as nausea.  Has a history of back surgery.   Review of Systems See HPI   HISTORY: Past Medical, Surgical, Social, and Family History Reviewed & Updated per EMR.   Pertinent Historical Findings include:  Past Medical History:  Diagnosis Date   Carpal tunnel syndrome     Past Surgical History:  Procedure Laterality Date   BACK SURGERY     SPINE SURGERY       PHYSICAL EXAM:  VS: BP (!) 142/100 (BP Location: Left Arm, Patient Position: Sitting)    Ht 6\' 3"  (1.905 m)    Wt 220 lb (99.8 kg)    BMI 27.50 kg/m  Physical Exam Gen: NAD, alert, cooperative with exam, well-appearing MSK:  Back/left leg: Positive straight leg raise on left. Weakness with plantarflexion and dorsiflexion. Diminished deep tendon reflex at the patella compared to the contralateral side. Neurovascularly intact       ASSESSMENT & PLAN:   Lumbar radiculopathy Acutely worsening.  Concern for infection or fracture given the severity and duration of the pain.  CT scan of the lumbar spine as well as x-ray have been unrevealing. -Counseled on home exercise therapy and supportive care. -CBC, CMP, sed rate and CRP. -MRI of lumbar spine to evaluate for discitis or occult fracture.  Does have a history of back surgery.  Considering epidural or presurgical planning.

## 2021-02-21 NOTE — Assessment & Plan Note (Addendum)
Acutely worsening.  Concern for infection or fracture given the severity and duration of the pain.  CT scan of the lumbar spine as well as x-ray have been unrevealing. -Counseled on home exercise therapy and supportive care. -CBC, CMP, sed rate and CRP. -MRI of lumbar spine to evaluate for discitis or occult fracture.  Does have a history of back surgery.  Considering epidural or presurgical planning.

## 2021-02-23 ENCOUNTER — Telehealth: Payer: Self-pay | Admitting: Family Medicine

## 2021-02-23 NOTE — Telephone Encounter (Signed)
Pt called ask for refill of :  oxyCODONE-acetaminophen (PERCOCET) 10-325 MG tablet [[id number]]    Order Details Dose: 1 tablet Route: Oral Frequency: Every 4 hours PRN for pain  Dispense Quantity: 10 tablet Refills: 0        Sig: Take 1 tablet by mouth every 4 (four) hours as needed for up to 10 doses for pain.         Forwarding request to provider that if approved send refill order to:   [id number] - [place], [place] - [address] AT [facility] [address] Phone:  [phone]  Fax:  [phone]    --glh

## 2021-02-24 ENCOUNTER — Ambulatory Visit: Payer: 59 | Admitting: Family Medicine

## 2021-02-24 LAB — COMPREHENSIVE METABOLIC PANEL
ALT: 58 IU/L — ABNORMAL HIGH (ref 0–44)
AST: 36 IU/L (ref 0–40)
Albumin/Globulin Ratio: 2 (ref 1.2–2.2)
Albumin: 5.3 g/dL — ABNORMAL HIGH (ref 4.0–5.0)
Alkaline Phosphatase: 110 IU/L (ref 44–121)
BUN/Creatinine Ratio: 10 (ref 9–20)
BUN: 13 mg/dL (ref 6–24)
Bilirubin Total: 0.4 mg/dL (ref 0.0–1.2)
CO2: 22 mmol/L (ref 20–29)
Calcium: 9.7 mg/dL (ref 8.7–10.2)
Chloride: 98 mmol/L (ref 96–106)
Creatinine, Ser: 1.32 mg/dL — ABNORMAL HIGH (ref 0.76–1.27)
Globulin, Total: 2.7 g/dL (ref 1.5–4.5)
Glucose: 91 mg/dL (ref 70–99)
Potassium: 4.2 mmol/L (ref 3.5–5.2)
Sodium: 140 mmol/L (ref 134–144)
Total Protein: 8 g/dL (ref 6.0–8.5)
eGFR: 67 mL/min/{1.73_m2} (ref >59)

## 2021-02-24 LAB — SEDIMENTATION RATE: Sed Rate: 4 mm/hr (ref 0–15)

## 2021-02-24 LAB — C-REACTIVE PROTEIN: CRP: 6 mg/L (ref 0–10)

## 2021-02-24 MED ORDER — OXYCODONE-ACETAMINOPHEN 10-325 MG PO TABS: 1.0000 | ORAL_TABLET | ORAL | 0 refills | Status: DC | PRN

## 2021-02-24 NOTE — Telephone Encounter (Signed)
Provided percocet until MRI is completed.   Myra Rude, MD [facility] 02/24/2021, 8:05 AM

## 2021-02-26 LAB — PATHOLOGIST SMEAR REVIEW
Basophils Absolute: 0 10*3/uL (ref 0.0–0.2)
Basos: 0 %
EOS (ABSOLUTE): 0.1 10*3/uL (ref 0.0–0.4)
Eos: 1 %
Hematocrit: 51.6 % — ABNORMAL HIGH (ref 37.5–51.0)
Hemoglobin: 17 g/dL (ref 13.0–17.7)
Immature Grans (Abs): 0 10*3/uL (ref 0.0–0.1)
Immature Granulocytes: 0 %
Lymphocytes Absolute: 2.8 10*3/uL (ref 0.7–3.1)
Lymphs: 45 %
MCH: 27.9 pg (ref 26.6–33.0)
MCHC: 32.9 g/dL (ref 31.5–35.7)
MCV: 85 fL (ref 79–97)
Monocytes Absolute: 0.6 10*3/uL (ref 0.1–0.9)
Monocytes: 10 %
Neutrophils Absolute: 2.8 10*3/uL (ref 1.4–7.0)
Neutrophils: 44 %
Path Rev PLTs: NORMAL
Path Rev WBC: NORMAL
Platelets: 334 10*3/uL (ref 150–450)
RBC: 6.09 x10E6/uL — ABNORMAL HIGH (ref 4.14–5.80)
RDW: 14.7 % (ref 11.6–15.4)
WBC: 6.3 10*3/uL (ref 3.4–10.8)

## 2021-02-28 ENCOUNTER — Telehealth: Payer: Self-pay | Admitting: Family Medicine

## 2021-02-28 NOTE — Addendum Note (Signed)
Addended by: Cyd Silence on: 02/28/2021 02:39 PM   Modules accepted: Orders

## 2021-02-28 NOTE — Telephone Encounter (Signed)
Order updated

## 2021-02-28 NOTE — Telephone Encounter (Signed)
--  Forwarding message to med asst to change Referral order to [facility] dept for MRI (they will accept pt's Ins coverage plan .  glh

## 2021-03-01 ENCOUNTER — Telehealth: Payer: Self-pay | Admitting: Family Medicine

## 2021-03-01 NOTE — Telephone Encounter (Signed)
Pt informed of below.   He is requesting a refill on Oxy/APAP 10-325.

## 2021-03-01 NOTE — Telephone Encounter (Signed)
Left VM for patient. If he calls back please have him speak with a nurse/CMA and inform his lab work does not reveal an infection or inflammatory oririn.   If any questions then please take the best time and phone number to call and I will try to call him back.   Rosemarie Ax, MD [facility] 03/01/2021, 3:20 PM

## 2021-03-02 MED ORDER — OXYCODONE-ACETAMINOPHEN 10-325 MG PO TABS: 1.0000 | ORAL_TABLET | ORAL | 0 refills | Status: DC | PRN

## 2021-03-02 NOTE — Telephone Encounter (Signed)
Provided pain medication until MRI is obtained.   Rosemarie Ax, MD [facility] 03/02/2021, 10:48 AM

## 2021-03-11 ENCOUNTER — Ambulatory Visit (INDEPENDENT_AMBULATORY_CARE_PROVIDER_SITE_OTHER): Payer: 59

## 2021-03-11 ENCOUNTER — Other Ambulatory Visit: Payer: Self-pay

## 2021-03-11 DIAGNOSIS — M5416 Radiculopathy, lumbar region: Secondary | ICD-10-CM | POA: Diagnosis not present

## 2021-03-13 ENCOUNTER — Encounter: Payer: Self-pay | Admitting: Family Medicine

## 2021-03-13 ENCOUNTER — Telehealth: Payer: Self-pay | Admitting: Family Medicine

## 2021-03-13 ENCOUNTER — Telehealth (INDEPENDENT_AMBULATORY_CARE_PROVIDER_SITE_OTHER): Payer: 59 | Admitting: Family Medicine

## 2021-03-13 ENCOUNTER — Other Ambulatory Visit (HOSPITAL_BASED_OUTPATIENT_CLINIC_OR_DEPARTMENT_OTHER): Payer: Self-pay

## 2021-03-13 DIAGNOSIS — M5127 Other intervertebral disc displacement, lumbosacral region: Secondary | ICD-10-CM | POA: Insufficient documentation

## 2021-03-13 MED ORDER — OXYCODONE-ACETAMINOPHEN 10-325 MG PO TABS
1.0000 | ORAL_TABLET | ORAL | 0 refills | Status: DC | PRN
  Filled 2021-03-13: qty 10, 2d supply, fill #0

## 2021-03-13 NOTE — Progress Notes (Signed)
Virtual Visit via Video Note  I connected with Edwin Cline on 03/13/21 at  1:10 PM EST by a video enabled telemedicine application and verified that I am speaking with the correct person using two identifiers.  Location: Patient: vehicle Provider: office   I discussed the limitations of evaluation and management by telemedicine and the availability of in person appointments. The patient expressed understanding and agreed to proceed.  History of Present Illness:  [name] is a 46 year old male that is following up after the MRI of his lumbar spine.  The MRI was revealing for a structure in the left subarticular recess which could represent a disc fragment.  It appears to be impinging the descending left S1 nerve root.   Observations/Objective:   Assessment and Plan:  Fragmented disc at L5-S1 on the left side: Acutely occurring.  Still in significant pain.  MRI was revealing for a suggested sequestered disc fragment that is having a mass effect on the exiting L5 nerve root. -Counseled on home exercise therapy and supportive care. - refilled percocet  -Referral to back surgeon.  Follow Up Instructions:    I discussed the assessment and treatment plan with the patient. The patient was provided an opportunity to ask questions and all were answered. The patient agreed with the plan and demonstrated an understanding of the instructions.   The patient was advised to call back or seek an in-person evaluation if the symptoms worsen or if the condition fails to improve as anticipated.    Edwin Coots, MD

## 2021-03-13 NOTE — Assessment & Plan Note (Signed)
Acutely occurring.  Still in significant pain.  MRI was revealing for a suggested sequestered disc fragment that is having a mass effect on the exiting L5 nerve root. -Counseled on home exercise therapy and supportive care. - refilled percocet  -Referral to back surgeon.

## 2021-03-13 NOTE — Telephone Encounter (Signed)
Patient called request note stating due to his back injury/pain he is unable to resume any exercise until cleared by provider.  --Gym membership fee to be returned.  --forwarding message to med asst.  -glh

## 2021-03-13 NOTE — Telephone Encounter (Signed)
Provided letter.   Myra Rude, MD [facility] 03/13/2021, 5:14 PM

## 2021-03-20 ENCOUNTER — Other Ambulatory Visit: Payer: Self-pay | Admitting: Family Medicine

## 2021-03-20 ENCOUNTER — Other Ambulatory Visit (HOSPITAL_BASED_OUTPATIENT_CLINIC_OR_DEPARTMENT_OTHER): Payer: Self-pay

## 2021-03-20 ENCOUNTER — Telehealth: Payer: Self-pay | Admitting: Family Medicine

## 2021-03-20 MED ORDER — OXYCODONE-ACETAMINOPHEN 10-325 MG PO TABS
1.0000 | ORAL_TABLET | Freq: Four times a day (QID) | ORAL | 0 refills | Status: DC | PRN
  Filled 2021-03-20: qty 40, 10d supply, fill #0

## 2021-03-20 NOTE — Telephone Encounter (Signed)
Patient called states his appt w/Neurology Surg isn't till 04/13/21 he has only 1 pill left-- Patient ask for refill of :  ?Rx #: [id number]  ?oxyCODONE-acetaminophen (PERCOCET) 10-325 MG tablet [[id number]]  ? ?Order Details ?Dose: 1 tablet Route: Oral Frequency: Every 4 hours PRN for pain  ?Dispense Quantity: 10 tablet Refills: 0 Fills remaining: 0  ?     ?Sig: Take 1 tablet by mouth every 4 (four) hours as needed for up to 10 doses for pain.  ? ? ?--- Patient ask for enough refill to get him to his appt date 04/13/21. ? ?--forwarding message to Provider. ? ?-glh ?

## 2021-03-20 NOTE — Telephone Encounter (Signed)
Provided refill of percocet until neurosurgery appointment.  ? ?Myra Rude, MD ?[facility] ?03/20/2021, 5:13 PM ? ?

## 2021-03-21 ENCOUNTER — Other Ambulatory Visit (HOSPITAL_BASED_OUTPATIENT_CLINIC_OR_DEPARTMENT_OTHER): Payer: Self-pay

## 2021-04-13 ENCOUNTER — Encounter: Payer: Self-pay | Admitting: Specialist

## 2021-04-13 ENCOUNTER — Ambulatory Visit (INDEPENDENT_AMBULATORY_CARE_PROVIDER_SITE_OTHER): Payer: 59

## 2021-04-13 ENCOUNTER — Other Ambulatory Visit (HOSPITAL_BASED_OUTPATIENT_CLINIC_OR_DEPARTMENT_OTHER): Payer: Self-pay

## 2021-04-13 ENCOUNTER — Ambulatory Visit (INDEPENDENT_AMBULATORY_CARE_PROVIDER_SITE_OTHER): Payer: 59 | Admitting: Specialist

## 2021-04-13 VITALS — BP 129/91 | HR 92 | Ht 75.0 in | Wt 220.0 lb

## 2021-04-13 DIAGNOSIS — M5127 Other intervertebral disc displacement, lumbosacral region: Secondary | ICD-10-CM | POA: Diagnosis not present

## 2021-04-13 DIAGNOSIS — M5416 Radiculopathy, lumbar region: Secondary | ICD-10-CM

## 2021-04-13 MED ORDER — OXYCODONE-ACETAMINOPHEN 10-325 MG PO TABS
1.0000 | ORAL_TABLET | Freq: Four times a day (QID) | ORAL | 0 refills | Status: DC | PRN
  Filled 2021-04-13: qty 40, 10d supply, fill #0

## 2021-04-13 MED ORDER — GABAPENTIN 300 MG PO CAPS
300.0000 mg | ORAL_CAPSULE | Freq: Every day | ORAL | 0 refills | Status: DC
  Filled 2021-04-13: qty 30, 30d supply, fill #0

## 2021-04-13 NOTE — Addendum Note (Signed)
Addended by: [name] on: 04/13/2021 12:56 PM ? ? Modules accepted: Orders ? ?

## 2021-04-13 NOTE — Patient Instructions (Signed)
Plan: Avoid bending, stooping and avoid lifting weights greater than 10 lbs. ?Avoid prolong standing and walking. ?Order for a new walker with wheels. ?Surgery scheduling secretary Kandice Hams, will call you in the next week to schedule for surgery.  ?Surgery recommended is a one level lumbar microdiscectomy left L5-S1  and this would be done with the OR microdiscectomy. ?Take hydrocodone for for pain. ?Risk of surgery includes risk of infection 1 in 300 patients, bleeding 01/998 chance you would need a transfusion.   Risk to the nerves is one in 10,000. ? ?Expect improved walking and standing tolerance. Expect relief of leg pain but numbness ?may persist depending on the length and degree of pressure that has been present. ?

## 2021-04-13 NOTE — Progress Notes (Addendum)
? ?Office Visit Note ?  ?Patient: Edwin Cline           ?Date of Birth: 05/14/1975           ?MRN: [id number] ?Visit Date: 04/13/2021 ?             ?Requested by: Rosemarie Ax, MD ?[address] ?[address] ?[place],  [place] [address] ?PCP: Patient, No Pcp Per (Inactive) ? ? ?Assessment & Plan: ?Visit Diagnoses:  ?1. Lumbar radiculopathy   ?2. Herniated nucleus pulposus, L5-S1   ? ? ?Plan: Avoid bending, stooping and avoid lifting weights greater than 10 lbs. ?Avoid prolong standing and walking. ?Order for a new walker with wheels. ?Surgery scheduling secretary Kandice Hams, will call you in the next week to schedule for surgery.  ?Surgery recommended is a one level lumbar microdiscectomy left L5-S1  and this would be done with the OR microdiscectomy. ?Take hydrocodone for for pain. ?Risk of surgery includes risk of infection 1 in 300 patients, bleeding 01/998 chance you would need a transfusion.   Risk to the nerves is one in 10,000. ? ?Expect improved walking and standing tolerance. Expect relief of leg pain but numbness ?may persist depending on the length and degree of pressure that has been present. ?  ? ?Follow-Up Instructions: Return in about 4 weeks (around 05/11/2021).  ? ?Orders:  ?Orders Placed This Encounter  ?Procedures  ?? XR Lumbar Spine 2-3 Views  ? ?Meds ordered this encounter  ?Medications  ?? oxyCODONE-acetaminophen (PERCOCET) 10-325 MG tablet  ?  Sig: Take 1 tablet by mouth every 6 (six) hours as needed for up to 10 doses for pain.  ?  Dispense:  40 tablet  ?  Refill:  0  ?? gabapentin (NEURONTIN) 300 MG capsule  ?  Sig: Take 1 capsule (300 mg total) by mouth at bedtime.  ?  Dispense:  30 capsule  ?  Refill:  0  ? ? ? ? Procedures: ?No procedures performed ? ? ?Clinical Data: ?Findings:  ?CLINICAL DATA:  Low back pain with left-sided radiculopathy. History ?of back surgery in 2012 ?? ?EXAM: ?MRI LUMBAR SPINE WITHOUT CONTRAST ?? ?TECHNIQUE: ?Multiplanar, multisequence MR imaging of the  lumbar spine was ?performed. No intravenous contrast was administered. ?? ?COMPARISON:  CT 02/06/2021 ?? ?FINDINGS: ?Segmentation:  Standard. ?? ?Alignment:  Physiologic. ?? ?Vertebrae:  No fracture, evidence of discitis, or bone lesion. ?? ?Conus medullaris and cauda equina: Conus extends to the L1-2 level. ?Fatty filum terminalis incidentally noted. Conus and cauda equina ?appear otherwise normal. ?? ?Paraspinal and other soft tissues: Negative. ?? ?Disc levels: ?? ?T12-L1 through L4-L5: Unremarkable. No disc protrusion. No canal or ?foraminal stenosis. ?? ?L5-S1: Disc height loss with mild endplate spurring. There is a ?focal low T1/T2 signal rounded structure in the left subarticular ?recess measuring 12 x 5 x 8 mm (series 6, image 35; series 2, image ?10), possibly representing a sequestered disc fragment. This ?structure impinges upon the descending left S1 nerve root and also ?result in mass effect upon the exiting left L5 nerve root. No canal ?stenosis. Foramina are patent. ?? ?IMPRESSION: ?1. Focal low signal rounded structure in the left subarticular ?recess measuring 12 x 5 x 8 mm, possibly representing a sequestered ?disc fragment. This structure impinges upon the descending left S1 ?nerve root and also result in mass effect upon the exiting left L5 ?nerve root. ?2. Remaining disc levels are unremarkable. No canal stenosis or ?foraminal stenosis at any level. ?? ?? ?Electronically Signed ?  By: Davina Poke D.O. ?  On: 03/12/2021 19:51 ?? ? ? ?Subjective: ?Chief Complaint  ?Patient presents with  ?? Lower Back - Pain  ? ? ?46  year old male with history of previous left L5-S1 discectomy by Dr. Amil Amen in 2012. Did well and returned to work. 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 He reports that began having recurrent pain in the back and radiation into the left leg that started about 2 months ago in January 2023. He reports it began after a workout in a gym playing basketball. The pain started in one to  two days and began to radiate into the left  ?Buttock and down the back of the left leg and lateral calf into the left  ?Foot. Pain is worse with standing in one place and bending and increases with cough or sneeze. Severe to the point he is having difficulty with sleeping due to severe left leg pain. Over all pain is some improved but he is experiencing difficulty with standing and walking, and is taking oxycodone 10 mg tablets every 6 hours. No bowel or bladder difficulty.  ? ? ?Review of Systems  ?Constitutional: Negative.   ?HENT: Negative.    ?Eyes: Negative.   ?Respiratory: Negative.    ?Cardiovascular: Negative.   ?Gastrointestinal: Negative.   ?Endocrine: Negative.   ?Genitourinary: Negative.   ?Musculoskeletal: Negative.   ?Skin: Negative.   ?Allergic/Immunologic: Negative.   ?Neurological: Negative.   ?Hematological: Negative.   ?Psychiatric/Behavioral: Negative.    ? ? ?Objective: ?Vital Signs: BP (!) 129/91 (BP Location: Left Arm, Patient Position: Sitting)   Pulse 92   Ht 6\' 3"  (1.905 m)   Wt 220 lb (99.8 kg)   BMI 27.50 kg/m?  ? ?Physical Exam ?Constitutional:   ?   Appearance: He is well-developed.  ?HENT:  ?   Head: Normocephalic and atraumatic.  ?Eyes:  ?   Pupils: Pupils are equal, round, and reactive to light.  ?Pulmonary:  ?   Effort: Pulmonary effort is normal.  ?   Breath sounds: Normal breath sounds.  ?Abdominal:  ?   General: Bowel sounds are normal.  ?   Palpations: Abdomen is soft.  ?Musculoskeletal:  ?   Cervical back: Normal range of motion and neck supple.  ?   Lumbar back: Positive right straight leg raise test and positive left straight leg raise test.  ?Skin: ?   General: Skin is warm and dry.  ?Neurological:  ?   Mental Status: He is alert and oriented to person, place, and time.  ?Psychiatric:     ?   Behavior: Behavior normal.     ?   Thought Content: Thought content normal.     ?   Judgment: Judgment normal.  ? ? ?Back Exam  ? ?Range of Motion  ?Extension:  abnormal  ?Flexion:   abnormal  ?Lateral bend right:  abnormal  ?Lateral bend left:  abnormal  ?Rotation right:  abnormal  ?Rotation left:  abnormal  ? ?Muscle Strength  ?The patient has normal back strength. ?Right Quadriceps:  5/5  ?Left Quadriceps:  5/5  ?Right Hamstrings:  5/5  ?Left Hamstrings:  5/5  ? ?Tests  ?Straight leg raise right: positive ?Straight leg raise left: positive ? ?Reflexes  ?Patellar:  2/4 ?Achilles:  0/4 ?Biceps:  2/4 ?Babinski's sign: normal  ? ?Other  ?Toe walk: abnormal ?Heel walk: abnormal ?Sensation: decreased ?Gait: drop-foot  ?Erythema: no back redness ?Scars: present ? ?Comments:  Left Foot DF is weak  3/5 left EHL weak 3/5, unable to heel or toe walk on left side due to foot weakness.  ? ? ? ? ?Specialty Comments:  ?No specialty comments available. ? ?Imaging: ?XR Lumbar Spine 2-3 Views ? ?Result Date: 04/13/2021 ?AP and lateral radiographs of the lumbar spine demonstrate disc narrowing L4-5 and L5-S1, no anterolisthesis, mild right hip narrowing with minimal lateral femoral head osteophytes.   ? ? ?PMFS History: ?Patient Active Problem List  ? Diagnosis Date Noted  ?? Herniated nucleus pulposus, L5-S1 03/13/2021  ?? Lumbar radiculopathy 02/15/2021  ? ?Past Medical History:  ?Diagnosis Date  ?? Carpal tunnel syndrome   ?  ?Family History  ?Problem Relation Age of Onset  ?? Diabetes Father   ?  ?Past Surgical History:  ?Procedure Laterality Date  ?? BACK SURGERY    ?? SPINE SURGERY    ? ?Social History  ? ?Occupational History  ?? Not on file  ?Tobacco Use  ?? Smoking status: Never  ?? Smokeless tobacco: Never  ?Vaping Use  ?? Vaping Use: Never used  ?Substance and Sexual Activity  ?? Alcohol use: Yes  ?  Comment: occasional  ?? Drug use: No  ?? Sexual activity: Not on file  ? ? ? ? ? ? ?

## 2021-04-14 ENCOUNTER — Other Ambulatory Visit: Payer: Self-pay

## 2021-04-14 NOTE — Progress Notes (Signed)
Surgical Instructions ? ? ? Your procedure is scheduled on 04/18/21. ? Report to [facility] Main Entrance "A" at 7:30 A.M., then check in with the Admitting office. ? Call this number if you have problems the morning of surgery: ? [phone] ? ? If you have any questions prior to your surgery date call [phone]: Open Monday-Friday 8am-4pm ? ? ? Remember: ? Do not eat after midnight the night before your surgery ? ?You may drink clear liquids until 6:30am the morning of your surgery.   ?Clear liquids allowed are: Water, Non-Citrus Juices (without pulp), Carbonated Beverages, Clear Tea, Black Coffee ONLY (NO MILK, CREAM OR POWDERED CREAMER of any kind), and Gatorade ? ?Patient Instructions ? ?The night before surgery:  ?No food after midnight. ONLY clear liquids after midnight ? ?The day of surgery (if you do NOT have diabetes):  ?Drink ONE (1) Pre-Surgery Clear Ensure by 6:30am the morning of surgery. Drink in one sitting. Do not sip.  ?This drink was given to you during your hospital  ?pre-op appointment visit. ?Nothing else to drink after completing the  ?Pre-Surgery Clear Ensure. ? ? ? ?       If you have questions, please contact your surgeon?s office. ? ?  ? Take these medicines the morning of surgery with A SIP OF WATER:  ?oxyCODONE-acetaminophen (PERCOCET) if needed ? ?As of today, STOP taking any Aspirin (unless otherwise instructed by your surgeon) Aleve, Naproxen, Ibuprofen, Motrin, Advil, Goody's, BC's, all herbal medications, fish oil, and all vitamins. ? ?         ?Do not wear jewelry or makeup ?Do not wear lotions, powders, perfumes/colognes, or deodorant. ?Do not shave 48 hours prior to surgery.   ?Do not bring valuables to the hospital. ?Do not wear nail polish, gel polish, artificial nails, or any other type of covering on natural nails (fingers and toes) ?If you have artificial nails or gel coating that need to be removed by a nail salon, please have this removed prior to surgery. Artificial  nails or gel coating may interfere with anesthesia's ability to adequately monitor your vital signs. ? ?[facility] is not responsible for any belongings or valuables. .  ? ?Do NOT Smoke (Tobacco/Vaping)  24 hours prior to your procedure ? ?If you use a CPAP at night, you may bring your mask for your overnight stay. ?  ?Contacts, glasses, hearing aids, dentures or partials may not be worn into surgery, please bring cases for these belongings ?  ?For patients admitted to the hospital, discharge time will be determined by your treatment team. ?  ?Patients discharged the day of surgery will not be allowed to drive home, and someone needs to stay with them for 24 hours. ? ? ?SURGICAL WAITING ROOM VISITATION ?Patients having surgery or a procedure in a hospital may have two support people. ?Children under the age of 52 must have an adult with them who is not the patient. ?They may stay in the waiting area during the procedure and may switch out with other visitors. If the patient needs to stay at the hospital during part of their recovery, the visitor guidelines for inpatient rooms apply. ? ?Please refer to the [facility] website for the visitor guidelines for Inpatients (after your surgery is over and you are in a regular room).  ? ? ? ? ? ?Special instructions:   ? ?Oral Hygiene is also important to reduce your risk of infection.  Remember - BRUSH YOUR TEETH THE MORNING OF SURGERY  WITH YOUR REGULAR TOOTHPASTE ? ? ?[facility]- Preparing For Surgery ? ?Before surgery, you can play an important role. Because skin is not sterile, your skin needs to be as free of germs as possible. You can reduce the number of germs on your skin by washing with CHG (chlorahexidine gluconate) Soap before surgery.  CHG is an antiseptic cleaner which kills germs and bonds with the skin to continue killing germs even after washing.   ? ? ?Please do not use if you have an allergy to CHG or antibacterial soaps. If your skin becomes  reddened/irritated stop using the CHG.  ?Do not shave (including legs and underarms) for at least 48 hours prior to first CHG shower. It is OK to shave your face. ? ?Please follow these instructions carefully. ?  ? ? Shower the NIGHT BEFORE SURGERY and the MORNING OF SURGERY with CHG Soap.  ? If you chose to wash your hair, wash your hair first as usual with your normal shampoo. After you shampoo, rinse your hair and body thoroughly to remove the shampoo.  Then [employer] and genitals (private parts) with your normal soap and rinse thoroughly to remove soap. ? ?After that Use CHG Soap as you would any other liquid soap. You can apply CHG directly to the skin and wash gently with a scrungie or a clean washcloth.  ? ?Apply the CHG Soap to your body ONLY FROM THE NECK DOWN.  Do not use on open wounds or open sores. Avoid contact with your eyes, ears, mouth and genitals (private parts). Wash Face and genitals (private parts)  with your normal soap.  ? ?Wash thoroughly, paying special attention to the area where your surgery will be performed. ? ?Thoroughly rinse your body with warm water from the neck down. ? ?DO NOT shower/wash with your normal soap after using and rinsing off the CHG Soap. ? ?Pat yourself dry with a CLEAN TOWEL. ? ?Wear CLEAN PAJAMAS to bed the night before surgery ? ?Place CLEAN SHEETS on your bed the night before your surgery ? ?DO NOT SLEEP WITH PETS. ? ? ?Day of Surgery: ?Take a shower with CHG soap. ?Wear Clean/Comfortable clothing the morning of surgery ?Do not apply any deodorants/lotions.   ?Remember to brush your teeth WITH YOUR REGULAR TOOTHPASTE. ? ? ? ?If you received a COVID test during your pre-op visit  it is requested that you wear a mask when out in public, stay away from anyone that may not be feeling well and notify your surgeon if you develop symptoms. If you have been in contact with anyone that has tested positive in the last 10 days please notify you surgeon. ? ?  ?Please read  over the following fact sheets that you were given.  ? ?

## 2021-04-17 ENCOUNTER — Encounter (HOSPITAL_COMMUNITY)
Admission: RE | Admit: 2021-04-17 | Discharge: 2021-04-17 | Disposition: A | Discharge status: Home / Self Care | Payer: 59 | Source: Ambulatory Visit | Attending: Specialist | Admitting: Specialist

## 2021-04-17 ENCOUNTER — Encounter (HOSPITAL_COMMUNITY): Payer: Self-pay

## 2021-04-17 ENCOUNTER — Other Ambulatory Visit: Payer: Self-pay

## 2021-04-17 DIAGNOSIS — Z01818 Encounter for other preprocedural examination: Secondary | ICD-10-CM | POA: Insufficient documentation

## 2021-04-17 LAB — SURGICAL PCR SCREEN
MRSA, PCR: NEGATIVE
Staphylococcus aureus: NEGATIVE

## 2021-04-17 LAB — COMPREHENSIVE METABOLIC PANEL
ALT: 41 U/L (ref 0–44)
AST: 23 U/L (ref 15–41)
Albumin: 4.2 g/dL (ref 3.5–5.0)
Alkaline Phosphatase: 86 U/L (ref 38–126)
Anion gap: 8 (ref 5–15)
BUN: 8 mg/dL (ref 6–20)
CO2: 25 mmol/L (ref 22–32)
Calcium: 9.1 mg/dL (ref 8.9–10.3)
Chloride: 105 mmol/L (ref 98–111)
Creatinine, Ser: 1.23 mg/dL (ref 0.61–1.24)
GFR, Estimated: >60 mL/min (ref >60)
Glucose, Bld: 90 mg/dL (ref 70–99)
Potassium: 3.7 mmol/L (ref 3.5–5.1)
Sodium: 138 mmol/L (ref 135–145)
Total Bilirubin: 0.5 mg/dL (ref 0.3–1.2)
Total Protein: 7.5 g/dL (ref 6.5–8.1)

## 2021-04-17 LAB — CBC
HCT: 46.1 % (ref 39.0–52.0)
Hemoglobin: 15.7 g/dL (ref 13.0–17.0)
MCH: 28.5 pg (ref 26.0–34.0)
MCHC: 34.1 g/dL (ref 30.0–36.0)
MCV: 83.8 fL (ref 80.0–100.0)
Platelets: 315 10*3/uL (ref 150–400)
RBC: 5.5 MIL/uL (ref 4.22–5.81)
RDW: 14 % (ref 11.5–15.5)
WBC: 5.3 10*3/uL (ref 4.0–10.5)
nRBC: 0 % (ref 0.0–0.2)

## 2021-04-17 NOTE — Progress Notes (Signed)
PCP - denies ?Cardiologist - denies ? ?PPM/ICD - denies ? ? ?Chest x-ray - 06/26/20 ?EKG - 04/17/21 ?Stress Test - denies ?ECHO - denies ?Cardiac Cath - denies ? ?Sleep Study - denies ? ?DM- denies ? ? ?ASA/Blood Thinner Instructions: n/a ? ? ?ERAS Protcol - yes ?PRE-SURGERY Ensure given at PAT ? ?COVID TEST- n/a ? ? ?Anesthesia review: no ? ?Patient denies shortness of breath, fever, cough and chest pain at PAT appointment ? ? ?All instructions explained to the patient, with a verbal understanding of the material. Patient agrees to go over the instructions while at home for a better understanding. Patient also instructed to notify surgeon if any contact is made with COVID + person or if he develops any symptoms. The opportunity to ask questions was provided. ?  ?

## 2021-04-18 ENCOUNTER — Other Ambulatory Visit: Payer: Self-pay

## 2021-04-18 ENCOUNTER — Ambulatory Visit (HOSPITAL_COMMUNITY): Payer: 59 | Admitting: Anesthesiology

## 2021-04-18 ENCOUNTER — Inpatient Hospital Stay (HOSPITAL_COMMUNITY)
Admission: RE | Admit: 2021-04-18 | Discharge: 2021-04-19 | DRG: 519 | Disposition: A | Discharge status: Home / Self Care | Payer: 59 | Attending: Specialist | Admitting: Specialist

## 2021-04-18 ENCOUNTER — Ambulatory Visit (HOSPITAL_COMMUNITY): Payer: 59

## 2021-04-18 ENCOUNTER — Encounter (HOSPITAL_COMMUNITY): Payer: Self-pay | Admitting: Specialist

## 2021-04-18 ENCOUNTER — Encounter (HOSPITAL_COMMUNITY)
Admission: RE | Disposition: A | Discharge status: Home / Self Care | Payer: Self-pay | Source: Home / Self Care | Attending: Specialist

## 2021-04-18 DIAGNOSIS — M5116 Intervertebral disc disorders with radiculopathy, lumbar region: Secondary | ICD-10-CM | POA: Diagnosis not present

## 2021-04-18 DIAGNOSIS — Z9102 Food additives allergy status: Secondary | ICD-10-CM

## 2021-04-18 DIAGNOSIS — F17290 Nicotine dependence, other tobacco product, uncomplicated: Secondary | ICD-10-CM | POA: Diagnosis not present

## 2021-04-18 DIAGNOSIS — M5416 Radiculopathy, lumbar region: Secondary | ICD-10-CM | POA: Diagnosis not present

## 2021-04-18 DIAGNOSIS — Z79899 Other long term (current) drug therapy: Secondary | ICD-10-CM

## 2021-04-18 DIAGNOSIS — Z01818 Encounter for other preprocedural examination: Principal | ICD-10-CM

## 2021-04-18 DIAGNOSIS — G9741 Accidental puncture or laceration of dura during a procedure: Secondary | ICD-10-CM | POA: Diagnosis not present

## 2021-04-18 DIAGNOSIS — M5127 Other intervertebral disc displacement, lumbosacral region: Secondary | ICD-10-CM | POA: Diagnosis not present

## 2021-04-18 DIAGNOSIS — M5126 Other intervertebral disc displacement, lumbar region: Secondary | ICD-10-CM | POA: Diagnosis not present

## 2021-04-18 DIAGNOSIS — M5117 Intervertebral disc disorders with radiculopathy, lumbosacral region: Principal | ICD-10-CM | POA: Diagnosis present

## 2021-04-18 HISTORY — PX: LUMBAR LAMINECTOMY/DECOMPRESSION MICRODISCECTOMY: SHX5026

## 2021-04-18 SURGERY — LUMBAR LAMINECTOMY/DECOMPRESSION MICRODISCECTOMY
Anesthesia: General | Site: Spine Lumbar | Laterality: Left

## 2021-04-18 MED ORDER — METHOCARBAMOL 500 MG PO TABS
500.0000 mg | ORAL_TABLET | Freq: Four times a day (QID) | ORAL | Status: DC | PRN
  Administered 2021-04-18: 500 mg via ORAL
  Filled 2021-04-18: qty 1

## 2021-04-18 MED ORDER — BUPIVACAINE HCL (PF) 0.5 % IJ SOLN
INTRAMUSCULAR | Status: AC
  Filled 2021-04-18: qty 30

## 2021-04-18 MED ORDER — FENTANYL CITRATE (PF) 100 MCG/2ML IJ SOLN
25.0000 ug | INTRAMUSCULAR | Status: DC | PRN
  Administered 2021-04-18 (×2): 50 ug via INTRAVENOUS

## 2021-04-18 MED ORDER — FENTANYL CITRATE (PF) 100 MCG/2ML IJ SOLN
INTRAMUSCULAR | Status: DC | PRN
Start: 2021-04-18 — End: 2021-04-18
  Administered 2021-04-18: 100 ug via INTRAVENOUS
  Administered 2021-04-18: 50 ug via INTRAVENOUS
  Administered 2021-04-18: 100 ug via INTRAVENOUS

## 2021-04-18 MED ORDER — HYDROMORPHONE HCL 1 MG/ML IJ SOLN
INTRAMUSCULAR | Status: DC | PRN
  Administered 2021-04-18 (×2): .5 mg via INTRAVENOUS

## 2021-04-18 MED ORDER — OXYCODONE HCL ER 15 MG PO T12A
15.0000 mg | EXTENDED_RELEASE_TABLET | Freq: Two times a day (BID) | ORAL | Status: DC
  Administered 2021-04-18 – 2021-04-19 (×2): 15 mg via ORAL
  Filled 2021-04-18 (×2): qty 1

## 2021-04-18 MED ORDER — BUPIVACAINE LIPOSOME 1.3 % IJ SUSP
10.0000 mL | Freq: Once | INTRAMUSCULAR | Status: DC
  Filled 2021-04-18: qty 10

## 2021-04-18 MED ORDER — 0.9 % SODIUM CHLORIDE (POUR BTL) OPTIME
TOPICAL | Status: DC | PRN
  Administered 2021-04-18: 1000 mL

## 2021-04-18 MED ORDER — PROPOFOL 10 MG/ML IV BOLUS
INTRAVENOUS | Status: DC | PRN
  Administered 2021-04-18: 200 mg via INTRAVENOUS

## 2021-04-18 MED ORDER — DEXAMETHASONE 4 MG PO TABS
4.0000 mg | ORAL_TABLET | Freq: Four times a day (QID) | ORAL | Status: DC
  Administered 2021-04-19 (×2): 4 mg via ORAL
  Filled 2021-04-18 (×5): qty 1

## 2021-04-18 MED ORDER — METHOCARBAMOL 1000 MG/10ML IJ SOLN
500.0000 mg | Freq: Four times a day (QID) | INTRAVENOUS | Status: DC | PRN
  Filled 2021-04-18: qty 5

## 2021-04-18 MED ORDER — CEFAZOLIN SODIUM-DEXTROSE 2-4 GM/100ML-% IV SOLN
2.0000 g | INTRAVENOUS | Status: AC
  Administered 2021-04-18: 2 g via INTRAVENOUS
  Filled 2021-04-18: qty 100

## 2021-04-18 MED ORDER — LIDOCAINE 2% (20 MG/ML) 5 ML SYRINGE
INTRAMUSCULAR | Status: AC
  Filled 2021-04-18: qty 5

## 2021-04-18 MED ORDER — THROMBIN 20000 UNITS EX SOLR
CUTANEOUS | Status: AC
  Filled 2021-04-18: qty 20000

## 2021-04-18 MED ORDER — SODIUM CHLORIDE 0.9 % IV SOLN: 250.0000 mL | INTRAVENOUS | Status: DC

## 2021-04-18 MED ORDER — ACETAMINOPHEN 650 MG RE SUPP: 650.0000 mg | RECTAL | Status: DC | PRN

## 2021-04-18 MED ORDER — POLYETHYLENE GLYCOL 3350 17 G PO PACK: 17.0000 g | PACK | Freq: Every day | ORAL | Status: DC | PRN

## 2021-04-18 MED ORDER — ONDANSETRON HCL 4 MG/2ML IJ SOLN
INTRAMUSCULAR | Status: DC | PRN
  Administered 2021-04-18: 4 mg via INTRAVENOUS

## 2021-04-18 MED ORDER — BUPIVACAINE HCL 0.5 % IJ SOLN
INTRAMUSCULAR | Status: DC | PRN
  Administered 2021-04-18: 20 mL

## 2021-04-18 MED ORDER — SODIUM CHLORIDE 0.9 % IV SOLN: INTRAVENOUS | Status: DC

## 2021-04-18 MED ORDER — DEXMEDETOMIDINE (PRECEDEX) IN NS 20 MCG/5ML (4 MCG/ML) IV SYRINGE
PREFILLED_SYRINGE | INTRAVENOUS | Status: AC
  Filled 2021-04-18: qty 5

## 2021-04-18 MED ORDER — ONDANSETRON HCL 4 MG PO TABS: 4.0000 mg | ORAL_TABLET | Freq: Four times a day (QID) | ORAL | Status: DC | PRN

## 2021-04-18 MED ORDER — ONDANSETRON HCL 4 MG/2ML IJ SOLN
4.0000 mg | Freq: Four times a day (QID) | INTRAMUSCULAR | Status: DC | PRN
  Filled 2021-04-18: qty 2

## 2021-04-18 MED ORDER — OXYCODONE HCL 5 MG PO TABS: 5.0000 mg | ORAL_TABLET | ORAL | Status: DC | PRN

## 2021-04-18 MED ORDER — ROCURONIUM BROMIDE 10 MG/ML (PF) SYRINGE
PREFILLED_SYRINGE | INTRAVENOUS | Status: DC | PRN
  Administered 2021-04-18: 20 mg via INTRAVENOUS
  Administered 2021-04-18: 60 mg via INTRAVENOUS

## 2021-04-18 MED ORDER — CHLORHEXIDINE GLUCONATE 0.12 % MT SOLN
15.0000 mL | Freq: Once | OROMUCOSAL | Status: AC
  Administered 2021-04-18: 15 mL via OROMUCOSAL
  Filled 2021-04-18: qty 15

## 2021-04-18 MED ORDER — ONDANSETRON HCL 4 MG/2ML IJ SOLN
4.0000 mg | Freq: Four times a day (QID) | INTRAMUSCULAR | Status: AC | PRN
  Administered 2021-04-18: 4 mg via INTRAVENOUS

## 2021-04-18 MED ORDER — DEXAMETHASONE SODIUM PHOSPHATE 10 MG/ML IJ SOLN
INTRAMUSCULAR | Status: AC
  Filled 2021-04-18: qty 1

## 2021-04-18 MED ORDER — CEFAZOLIN SODIUM-DEXTROSE 2-4 GM/100ML-% IV SOLN
2.0000 g | Freq: Three times a day (TID) | INTRAVENOUS | Status: AC
  Administered 2021-04-18 – 2021-04-19 (×2): 2 g via INTRAVENOUS
  Filled 2021-04-18 (×2): qty 100

## 2021-04-18 MED ORDER — BUPIVACAINE LIPOSOME 1.3 % IJ SUSP
INTRAMUSCULAR | Status: AC
  Filled 2021-04-18: qty 20

## 2021-04-18 MED ORDER — ROCURONIUM BROMIDE 10 MG/ML (PF) SYRINGE
PREFILLED_SYRINGE | INTRAVENOUS | Status: AC
  Filled 2021-04-18: qty 10

## 2021-04-18 MED ORDER — MIDAZOLAM HCL 2 MG/2ML IJ SOLN
INTRAMUSCULAR | Status: DC | PRN
  Administered 2021-04-18: 2 mg via INTRAVENOUS

## 2021-04-18 MED ORDER — HYDROMORPHONE HCL 1 MG/ML IJ SOLN
INTRAMUSCULAR | Status: AC
Start: 2021-04-18 — End: ?
  Filled 2021-04-18: qty 0.5

## 2021-04-18 MED ORDER — OXYCODONE HCL 5 MG/5ML PO SOLN: 5.0000 mg | Freq: Once | ORAL | Status: DC | PRN

## 2021-04-18 MED ORDER — FENTANYL CITRATE (PF) 100 MCG/2ML IJ SOLN
INTRAMUSCULAR | Status: AC
  Filled 2021-04-18: qty 2

## 2021-04-18 MED ORDER — PANTOPRAZOLE SODIUM 40 MG IV SOLR
40.0000 mg | Freq: Every day | INTRAVENOUS | Status: DC
  Administered 2021-04-18: 40 mg via INTRAVENOUS
  Filled 2021-04-18: qty 10

## 2021-04-18 MED ORDER — MENTHOL 3 MG MT LOZG: 1.0000 | LOZENGE | OROMUCOSAL | Status: DC | PRN

## 2021-04-18 MED ORDER — GABAPENTIN 300 MG PO CAPS
300.0000 mg | ORAL_CAPSULE | Freq: Two times a day (BID) | ORAL | Status: DC
  Administered 2021-04-18 – 2021-04-19 (×2): 300 mg via ORAL
  Filled 2021-04-18 (×2): qty 1

## 2021-04-18 MED ORDER — MIDAZOLAM HCL 2 MG/2ML IJ SOLN
INTRAMUSCULAR | Status: AC
  Filled 2021-04-18: qty 2

## 2021-04-18 MED ORDER — LIDOCAINE 2% (20 MG/ML) 5 ML SYRINGE
INTRAMUSCULAR | Status: DC | PRN
  Administered 2021-04-18: 100 mg via INTRAVENOUS

## 2021-04-18 MED ORDER — ACETAMINOPHEN 325 MG PO TABS: 650.0000 mg | ORAL_TABLET | ORAL | Status: DC | PRN

## 2021-04-18 MED ORDER — SUGAMMADEX SODIUM 500 MG/5ML IV SOLN
INTRAVENOUS | Status: DC | PRN
  Administered 2021-04-18: 300 mg via INTRAVENOUS

## 2021-04-18 MED ORDER — BUPIVACAINE LIPOSOME 1.3 % IJ SUSP
INTRAMUSCULAR | Status: DC | PRN
  Administered 2021-04-18: 10 mL

## 2021-04-18 MED ORDER — ONDANSETRON HCL 4 MG/2ML IJ SOLN
INTRAMUSCULAR | Status: AC
  Filled 2021-04-18: qty 2

## 2021-04-18 MED ORDER — MORPHINE SULFATE (PF) 2 MG/ML IV SOLN
1.0000 mg | INTRAVENOUS | Status: DC | PRN
  Administered 2021-04-18: 1 mg via INTRAVENOUS
  Filled 2021-04-18: qty 1

## 2021-04-18 MED ORDER — FENTANYL CITRATE (PF) 250 MCG/5ML IJ SOLN
INTRAMUSCULAR | Status: AC
  Filled 2021-04-18: qty 5

## 2021-04-18 MED ORDER — PHENOL 1.4 % MT LIQD: 1.0000 | OROMUCOSAL | Status: DC | PRN

## 2021-04-18 MED ORDER — DEXMEDETOMIDINE (PRECEDEX) IN NS 20 MCG/5ML (4 MCG/ML) IV SYRINGE
PREFILLED_SYRINGE | INTRAVENOUS | Status: DC | PRN
  Administered 2021-04-18: 8 ug via INTRAVENOUS
  Administered 2021-04-18: 4 ug via INTRAVENOUS
  Administered 2021-04-18: 8 ug via INTRAVENOUS

## 2021-04-18 MED ORDER — DEXAMETHASONE SODIUM PHOSPHATE 10 MG/ML IJ SOLN
4.0000 mg | Freq: Four times a day (QID) | INTRAMUSCULAR | Status: DC
  Administered 2021-04-18 – 2021-04-19 (×2): 4 mg via INTRAVENOUS
  Filled 2021-04-18 (×2): qty 1

## 2021-04-18 MED ORDER — SUGAMMADEX SODIUM 200 MG/2ML IV SOLN
INTRAVENOUS | Status: DC | PRN
  Administered 2021-04-18: 300 mg via INTRAVENOUS

## 2021-04-18 MED ORDER — FLEET ENEMA 7-19 GM/118ML RE ENEM: 1.0000 | ENEMA | Freq: Once | RECTAL | Status: DC | PRN

## 2021-04-18 MED ORDER — ALUM & MAG HYDROXIDE-SIMETH 200-200-20 MG/5ML PO SUSP: 30.0000 mL | Freq: Four times a day (QID) | ORAL | Status: DC | PRN

## 2021-04-18 MED ORDER — OXYCODONE HCL 5 MG PO TABS: 5.0000 mg | ORAL_TABLET | Freq: Once | ORAL | Status: DC | PRN

## 2021-04-18 MED ORDER — DOCUSATE SODIUM 100 MG PO CAPS
100.0000 mg | ORAL_CAPSULE | Freq: Two times a day (BID) | ORAL | Status: DC
  Administered 2021-04-18 – 2021-04-19 (×2): 100 mg via ORAL
  Filled 2021-04-18 (×2): qty 1

## 2021-04-18 MED ORDER — HYDROMORPHONE HCL 1 MG/ML IJ SOLN
INTRAMUSCULAR | Status: AC
  Filled 2021-04-18: qty 0.5

## 2021-04-18 MED ORDER — GELATIN ABSORBABLE 100 EX MISC
CUTANEOUS | Status: DC | PRN
  Administered 2021-04-18: 20 mL

## 2021-04-18 MED ORDER — DEXAMETHASONE SODIUM PHOSPHATE 10 MG/ML IJ SOLN
INTRAMUSCULAR | Status: DC | PRN
  Administered 2021-04-18: 10 mg via INTRAVENOUS

## 2021-04-18 MED ORDER — BISACODYL 5 MG PO TBEC: 5.0000 mg | DELAYED_RELEASE_TABLET | Freq: Every day | ORAL | Status: DC | PRN

## 2021-04-18 MED ORDER — EPINEPHRINE 0.3 MG/0.3ML IJ SOAJ
0.3000 mg | INTRAMUSCULAR | Status: DC | PRN
  Filled 2021-04-18: qty 0.6

## 2021-04-18 MED ORDER — OXYCODONE HCL 5 MG PO TABS
10.0000 mg | ORAL_TABLET | ORAL | Status: DC | PRN
  Administered 2021-04-18 – 2021-04-19 (×3): 10 mg via ORAL
  Filled 2021-04-18 (×3): qty 2

## 2021-04-18 MED ORDER — ORAL CARE MOUTH RINSE: 15.0000 mL | Freq: Once | OROMUCOSAL | Status: AC

## 2021-04-18 MED ORDER — SODIUM CHLORIDE 0.9% FLUSH: 3.0000 mL | Freq: Two times a day (BID) | INTRAVENOUS | Status: DC

## 2021-04-18 MED ORDER — LACTATED RINGERS IV SOLN: INTRAVENOUS | Status: DC

## 2021-04-18 MED ORDER — SODIUM CHLORIDE 0.9% FLUSH: 3.0000 mL | INTRAVENOUS | Status: DC | PRN

## 2021-04-18 SURGICAL SUPPLY — 51 items
BAG COUNTER SPONGE SURGICOUNT (BAG) ×2 IMPLANT
BUR SABER RD CUTTING 3.0 (BURR) IMPLANT
CANISTER SUCT 3000ML PPV (MISCELLANEOUS) ×2 IMPLANT
COVER SURGICAL LIGHT HANDLE (MISCELLANEOUS) ×2 IMPLANT
DERMABOND ADVANCED (GAUZE/BANDAGES/DRESSINGS) ×1
DERMABOND ADVANCED .7 DNX12 (GAUZE/BANDAGES/DRESSINGS) ×1 IMPLANT
DRAPE HALF SHEET 40X57 (DRAPES) IMPLANT
DRAPE INCISE IOBAN 66X45 STRL (DRAPES) IMPLANT
DRAPE MICROSCOPE LEICA (MISCELLANEOUS) ×2 IMPLANT
DRAPE SURG 17X23 STRL (DRAPES) ×8 IMPLANT
DRSG MEPILEX BORDER 4X4 (GAUZE/BANDAGES/DRESSINGS) IMPLANT
DRSG MEPILEX BORDER 4X8 (GAUZE/BANDAGES/DRESSINGS) ×1 IMPLANT
DURAPREP 26ML APPLICATOR (WOUND CARE) ×2 IMPLANT
ELECT REM PT RETURN 9FT ADLT (ELECTROSURGICAL) ×2
ELECTRODE REM PT RTRN 9FT ADLT (ELECTROSURGICAL) ×1 IMPLANT
EVACUATOR 1/8 PVC DRAIN (DRAIN) IMPLANT
GLOVE SRG 8 PF TXTR STRL LF DI (GLOVE) ×1 IMPLANT
GLOVE SURG 8.5 LATEX PF (GLOVE) ×2 IMPLANT
GLOVE SURG LTX SZ9 (GLOVE) ×2 IMPLANT
GLOVE SURG ORTHO LTX SZ7.5 (GLOVE) ×2 IMPLANT
GLOVE SURG UNDER POLY LF SZ8 (GLOVE) ×2
GOWN STRL REUS W/ TWL LRG LVL3 (GOWN DISPOSABLE) ×1 IMPLANT
GOWN STRL REUS W/TWL 2XL LVL3 (GOWN DISPOSABLE) ×4 IMPLANT
GOWN STRL REUS W/TWL LRG LVL3 (GOWN DISPOSABLE) ×2
GRAFT DURAGEN MATRIX 1WX1L (Tissue) ×1 IMPLANT
KIT BASIN OR (CUSTOM PROCEDURE TRAY) ×2 IMPLANT
KIT TURNOVER KIT B (KITS) ×2 IMPLANT
NDL SPNL 18GX3.5 QUINCKE PK (NEEDLE) ×2 IMPLANT
NEEDLE SPNL 18GX3.5 QUINCKE PK (NEEDLE) ×4 IMPLANT
NS IRRIG 1000ML POUR BTL (IV SOLUTION) ×2 IMPLANT
PACK LAMINECTOMY ORTHO (CUSTOM PROCEDURE TRAY) ×2 IMPLANT
PAD ARMBOARD 7.5X6 YLW CONV (MISCELLANEOUS) ×4 IMPLANT
PATTIES SURGICAL .5 X.5 (GAUZE/BANDAGES/DRESSINGS) IMPLANT
PATTIES SURGICAL .75X.75 (GAUZE/BANDAGES/DRESSINGS) IMPLANT
PATTIES SURGICAL 1X1 (DISPOSABLE) IMPLANT
SEALANT ADHERUS EXTEND TIP (MISCELLANEOUS) ×1 IMPLANT
SPONGE SURGIFOAM ABS GEL 100 (HEMOSTASIS) IMPLANT
SPONGE T-LAP 4X18 ~~LOC~~+RFID (SPONGE) IMPLANT
STAPLER VISISTAT 35W (STAPLE) ×1 IMPLANT
SUT VIC AB 0 CT1 27 (SUTURE)
SUT VIC AB 0 CT1 27XBRD ANBCTR (SUTURE) IMPLANT
SUT VIC AB 1 CT1 27 (SUTURE)
SUT VIC AB 1 CT1 27XBRD ANBCTR (SUTURE) IMPLANT
SUT VIC AB 2-0 CT1 27 (SUTURE)
SUT VIC AB 2-0 CT1 TAPERPNT 27 (SUTURE) IMPLANT
SUT VIC AB 3-0 X1 27 (SUTURE) ×2 IMPLANT
SUT VICRYL 0 UR6 27IN ABS (SUTURE) ×2 IMPLANT
TOWEL GREEN STERILE (TOWEL DISPOSABLE) ×2 IMPLANT
TOWEL GREEN STERILE FF (TOWEL DISPOSABLE) ×2 IMPLANT
TRAY FOLEY MTR SLVR 16FR STAT (SET/KITS/TRAYS/PACK) IMPLANT
WATER STERILE IRR 1000ML POUR (IV SOLUTION) ×2 IMPLANT

## 2021-04-18 NOTE — Anesthesia Preprocedure Evaluation (Signed)
Anesthesia Evaluation  ?Patient identified by MRN, date of birth, ID band ?Patient awake ? ? ? ?Reviewed: ?Allergy & Precautions, H&P , NPO status , Patient's Chart, lab work & pertinent test results ? ?Airway ?Mallampati: II ? ? ?Neck ROM: full ? ? ? Dental ?  ?Pulmonary ?Current Smoker and Patient abstained from smoking.,  ?  ?breath sounds clear to auscultation ? ? ? ? ? ? Cardiovascular ?negative cardio ROS ? ? ?Rhythm:regular Rate:Normal ? ? ?  ?Neuro/Psych ? Neuromuscular disease   ? GI/Hepatic ?  ?Endo/Other  ? ? Renal/GU ?  ? ?  ?Musculoskeletal ? ? Abdominal ?  ?Peds ? Hematology ?  ?Anesthesia Other Findings ? ? Reproductive/Obstetrics ? ?  ? ? ? ? ? ? ? ? ? ? ? ? ? ?  ?  ? ? ? ? ? ? ? ? ?Anesthesia Physical ?Anesthesia Plan ? ?ASA: 2 ? ?Anesthesia Plan: General  ? ?Post-op Pain Management:   ? ?Induction: Intravenous ? ?PONV Risk Score and Plan: 1 and Ondansetron, Dexamethasone, Midazolam and Treatment may vary due to age or medical condition ? ?Airway Management Planned: Oral ETT ? ?Additional Equipment:  ? ?Intra-op Plan:  ? ?Post-operative Plan: Extubation in OR ? ?Informed Consent: I have reviewed the patients History and Physical, chart, labs and discussed the procedure including the risks, benefits and alternatives for the proposed anesthesia with the patient or authorized representative who has indicated his/her understanding and acceptance.  ? ? ? ?Dental advisory given ? ?Plan Discussed with: CRNA, Anesthesiologist and Surgeon ? ?Anesthesia Plan Comments:   ? ? ? ? ? ? ?Anesthesia Quick Evaluation ? ?

## 2021-04-18 NOTE — Transfer of Care (Signed)
Immediate Anesthesia Transfer of Care Note ? ?Patient: Edwin Cline ? ?Procedure(s) Performed: REDO MICRODISCECTOMY Left L5-S1 (Left: Spine Lumbar) ? ?Patient Location: PACU ? ?Anesthesia Type:General ? ?Level of Consciousness: drowsy and patient cooperative ? ?Airway & Oxygen Therapy: Patient Spontanous Breathing and Patient connected to face mask oxygen ? ?Post-op Assessment: Report given to RN and Post -op Vital signs reviewed and stable ? ?Post vital signs: Reviewed and stable ? ?Last Vitals:  ?Vitals Value Taken Time  ?BP 148/92 04/18/21 1211  ?Temp    ?Pulse 88 04/18/21 1212  ?Resp 21 04/18/21 1212  ?SpO2 100 % 04/18/21 1212  ?Vitals shown include unvalidated device data. ? ?Last Pain:  ?Vitals:  ? 04/18/21 0805  ?TempSrc: Oral  ?PainSc:   ?   ? ?Patients Stated Pain Goal: 2 (04/18/21 0801) ? ?Complications: No notable events documented. ?

## 2021-04-18 NOTE — Anesthesia Procedure Notes (Signed)
Procedure Name: Intubation ?Date/Time: 04/18/2021 10:13 AM ?Performed by: Genelle Bal, CRNA ?Pre-anesthesia Checklist: Patient identified, Emergency Drugs available, Suction available and Patient being monitored ?Patient Re-evaluated:Patient Re-evaluated prior to induction ?Oxygen Delivery Method: Circle system utilized ?Preoxygenation: Pre-oxygenation with 100% oxygen ?Induction Type: IV induction ?Ventilation: Mask ventilation without difficulty and Oral airway inserted - appropriate to patient size ?Laryngoscope Size: Sabra Heck and 2 ?Grade View: Grade III ?Tube type: Oral ?Tube size: 7.5 mm ?Number of attempts: 1 ?Airway Equipment and Method: Stylet and Oral airway ?Placement Confirmation: ETT inserted through vocal cords under direct vision, positive ETCO2 and breath sounds checked- equal and bilateral ?Secured at: 23 cm ?Tube secured with: Tape ?Dental Injury: Teeth and Oropharynx as per pre-operative assessment  ? ? ? ? ?

## 2021-04-18 NOTE — Interval H&P Note (Signed)
History and Physical Interval Note: ? ?04/18/2021 ?9:22 AM ? ?Edwin Cline  has presented today for surgery, with the diagnosis of left L5-S1 recurrent herniated nucleus pulposus.  The various methods of treatment have been discussed with the patient and family. After consideration of risks, benefits and other options for treatment, the patient has consented to  Procedure(s): ?REDO MICRODISCECTOMY L5-S1 (N/A) as a surgical intervention.  The patient's history has been reviewed, patient examined, no change in status, stable for surgery.  I have reviewed the patient's chart and labs.  Questions were answered to the patient's satisfaction.   ? ? ?[name] ? ? ?

## 2021-04-18 NOTE — H&P (Signed)
PREOPERATIVE H&P ? ?Chief Complaint: left L5-S1 recurrent herniated nucleus pulposus ? ?HPI: ?Edwin Cline is a 46 y.o. male who presents for preoperative history and physical with a diagnosis of left L5-S1 recurrent herniated nucleus pulposus. Symptoms are rated as moderate to severe, and have been worsening.  This is significantly impairing activities of daily living.  He has elected for surgical management.  ? ?Past Medical History:  ?Diagnosis Date  ? Carpal tunnel syndrome   ? ?Past Surgical History:  ?Procedure Laterality Date  ? BACK SURGERY  2012  ? ?Social History  ? ?Socioeconomic History  ? Marital status: Married  ?  Spouse name: Not on file  ? Number of children: 2  ? Years of education: Not on file  ? Highest education level: Not on file  ?Occupational History  ? Not on file  ?Tobacco Use  ? Smoking status: Every Day  ?  Types: Cigars  ? Smokeless tobacco: Never  ? Tobacco comments:  ?  Smokes black n milds every day  ?Vaping Use  ? Vaping Use: Never used  ?Substance and Sexual Activity  ? Alcohol use: Not Currently  ? Drug use: No  ? Sexual activity: Not on file  ?Other Topics Concern  ? Not on file  ?Social History Narrative  ? Not on file  ? ?Social Determinants of Health  ? ?Financial Resource Strain: Not on file  ?Food Insecurity: Not on file  ?Transportation Needs: Not on file  ?Physical Activity: Not on file  ?Stress: Not on file  ?Social Connections: Not on file  ? ?Family History  ?Problem Relation Age of Onset  ? Diabetes Father   ? ?Allergies  ?Allergen Reactions  ? Shrimp Flavor [Flavoring Agent] Hives  ? ?Prior to Admission medications   ?Medication Sig Start Date End Date Taking? Authorizing Provider  ?oxyCODONE-acetaminophen (PERCOCET) 10-325 MG tablet Take 1 tablet by mouth every 6 (six) hours as needed for up to 10 doses for pain. 04/13/21  Yes Kerrin Champagne, MD  ?cyclobenzaprine (FLEXERIL) 10 MG tablet Take 1 tablet (10 mg total) by mouth 2 (two) times daily as needed for muscle  spasms. ?Patient not taking: Reported on 04/14/2021 02/05/21   Milagros Loll, MD  ?EPINEPHrine 0.3 mg/0.3 mL IJ SOAJ injection Inject 0.3 mg into the muscle as needed for anaphylaxis. ?Patient not taking: Reported on 04/14/2021 01/24/20   Petrucelli, Lelon Mast R, PA-C  ?gabapentin (NEURONTIN) 300 MG capsule Take 1 capsule (300 mg total) by mouth at bedtime. ?Patient not taking: Reported on 04/14/2021 04/13/21   Kerrin Champagne, MD  ?lidocaine (LIDODERM) 5 % Place 1 patch onto the skin daily. Remove & Discard patch within 12 hours or as directed by MD ?Patient not taking: Reported on 04/14/2021 02/20/21   Cheryll Cockayne, MD  ?meloxicam (MOBIC) 7.5 MG tablet Take 1 tablet (7.5 mg total) by mouth 2 (two) times daily as needed. ?Patient not taking: Reported on 04/14/2021 02/15/21   Myra Rude, MD  ? ? ? ?Positive ROS: All other systems have been reviewed and were otherwise negative with the exception of those mentioned in the HPI and as above. ? ?Physical Exam: ?General: Alert, no acute distress ?Cardiovascular: No pedal edema ?Respiratory: No cyanosis, no use of accessory musculature ?GI: No organomegaly, abdomen is soft and non-tender ?Skin: No lesions in the area of chief complaint ?Neurologic: Sensation intact distally ?Psychiatric: Patient is competent for consent with normal mood and affect ?Lymphatic: No axillary or cervical lymphadenopathy ? ?MUSCULOSKELETAL:  Left foot DF weakness 4/5, Left foot gastroc weakness 4/5 SLR positive left and opposite SLR positive on the right side. Leaning to right while sitting.  ?Toe and heel walking with weakness left foot.  ? ?Assessment: ?left L5-S1 recurrent herniated nucleus pulposus ? ?Plan: ?Plan for Procedure(s): ?REDO MICRODISCECTOMY L5-S1 ? ?The risks benefits and alternatives were discussed with the patient including but not limited to the risks of nonoperative treatment, versus surgical intervention including infection, bleeding, nerve injury,  blood clots,  cardiopulmonary complications, morbidity, mortality, among others, and they were willing to proceed.  ? ?[name], MD ?Cell [phone] ?Office [phone] ?04/18/2021 ?9:03 AM ?  ? ?

## 2021-04-18 NOTE — Op Note (Addendum)
04/18/2021 ? ?11:54 AM ? ?PATIENT:  Edwin Cline  46 y.o. male ? ?MRN: [id number] ? ?OPERATIVE REPORT ? ?PRE-OPERATIVE DIAGNOSIS:  left L5-S1 recurrent herniated nucleus pulposus ? ?POST-OPERATIVE DIAGNOSIS: Recurrent disc herniation left L5-S1, 46m dural tear left ventral lateral thecal sac distal to the take off of the left S1 nerve root.  ? ?PROCEDURE:  Procedure(s): ?REDO MICRODISCECTOMY Left L5-S1 ?   ?SURGEON:  JJessy Oto MD  ?   ?ASSISTANT:  JBenjiman Core PA-C  (Present throughout the entire procedure and necessary for completion of procedure in a timely manner)  ?   ?ANESTHESIA:  General, Dr. HMarcie Bal Supplemented with local marcaine 0.5% 2:1 exparel total 30cc. ?   ?COMPLICATIONS:Dural tear anterior paramedian to the axillary area less than 174m repaired with 1/4" square duragen and duraseal fibrin glue.  ?  ? ?PROCEDURE:The patient was met in the holding area, and the appropriate left Lumbar level L5-S1 identified and marked with "x" and my initials.The patient was then transported to OR and was placed under general anesthesia without difficulty. The patient received appropriate preoperative antibiotic prophylaxis. The patient after intubation atraumatically was transferred to the operating room table, prone position, Wilson frame, sliding OR table. All pressure points were well padded. The arms in 90-90 well-padded at the elbows. Standard prep with DuraPrep solution lower dorsal spine to the mid sacral segment. Draped in the usual manner iodine Vi-Drape was used. Time-out procedure was called and correct.Cross table lateral radiograph with spinal needles, the lower needle was at the posterior L5-S1 level. The skin incision scar at L5-S1 marked and was  infiltrated with Marcaine half percent 2:1 Exparel 1.3% total of 30 cc used. An incision approximately 2 1/2 inch in length was then made through skin and subcutaneous layers ellipsing the old skin incision in line with the left side of the expected  midline. An incision made into the lumbosacral fascia approximately an inch and one half in length along the left L5-S1 spinous processes. The paraspinal muscles elevated from the left side of the spinous processes and lamina of L5 and S1 ?Boss retractor then placed and a Penfield #4 placed at the inferior aspect of the left L4-5 facet a lateral radiograph identified the marker at the level of the L4-5 facet. The incision cared to the L5-S1 level using electrocautery and Cobb elevator. Another radiograph obtained demonstrating a Penfield #4 at the L5-S1 facet and this was then marked with an OR marking pen.  The operating room microscope sterilely draped brought into the field. Under the operating room microscope, the L5-S1 interspace carefully debrided the small amount of muscle attachment here and microcurettes used to define the previous laminotomy left L5-S1. A high-speed bur used to drill the medial aspect of the inferior articular process of L5.  In order to medialize the laminotomy the left side of the L5 spinous process was thinned using a high speed bur. 3 mm Kerrison then used to enter the spinal canal over the medial  aspect of the L5 inferior articular process carefully using the Kerrison to debris the attachment as a curet. Foraminotomy was then performed over the left L5 nerve root. The medial 10% superior articular process of S1  then resected using 3 mm Kerrison. This allowed for identification of the thecal sac and the lateral aspect of the left S1 nerve root. Penfield 4 was then used to carefully mobilize the thecal sac medially and the left S1 nerve root identified within the lateral recess flattened over the posterior  aspect of the herniated disc. Carefully the lateral aspect of the S1 nerve root was identified and a Penfield 4 was used to mobilize the nerve medially such that the lateral aspect of the herniated disc was visible with microscope. Using a Derricho  for retraction and a Gaspar Garbe #4  was used to define the posterior longitudinal ligament then a 15 blade scalpel was used to incise the lateral recess on the left side longitudinally. Disc material immediately extruded and this was removed using micropituitary rongeurs and nerve hook nerve root and then more easily able to be mobilized medially and retracted using a Derricho retractor. Further foraminotomies was performed over the L5 and S1 nerve roots and  the nerve roots were noted to be exiting without further compression. The nerve root able to be retracted along the medial aspect of the S1 pedicle and no furtherdisc material found to be subligamentous at this level was further resected current pituitary rongeurs. ?Ligamentum flavum was further debrided superiorly to the level L5-S1 disc. Had a small amount of further resection of the L5 lamina inferiorly was performed. With this then the disc space at L5-S1 was easily visualized and entry into the disc at the sided disc herniation was possible using a Penfield 4.  Micropituitary was used to further debride this material superficially from the posterior aspect of the intervertebral disc is posterior lateral aspect of the disc. Small amount of further disc material was found subligamentous extending superiorly from the disc this was removed using micropituitary rongeurs. Ligamentum flavum was debrided and lateral recess along the medial aspect L5-S1 facet no further decompression was necessary. Ball tip nerve probe was then able to carefully palpate the neuroforamen for L5 and S1 finding these to be well decompressed. A small amount of CSF noted anterior to the left S1 nerve root shoulder and the anterior spinal canal. This represents a tiny tear in the dural that occurred with freeing the thecal sac off the posterior disc and just superior to the disc space, The amount of CSF with valsalva was 2 cc. The area of bleeding was due to epidural venous bleeding was controlled with gel foam and  cottonoids a  1/4 inch square portion of duragen was cut and this was made 1/2  of the normal thickness to decrease any tendency to causing a mass effect on the thecal sac. The duragen placed ventral to the thecal sac at the left lateral margin medial to the take off of the left S1 nerve root. Duraseal then used to adhese the patch to the ventral thecal sac. The left laminotomy then further layered with the green duraseal. Valsalva following the laying on of the patch and duraseal demonstrated no CSF leak. Bleeding within the soft tissue mass the laminotomy area was controlled using bipolar electrocautery. Irrigation was carried out using copious amounts of irrigant solution. No significant active bleeding present at the time of removal. All instruments sponge counts were correct traction system was then carefully removed. Lumbodorsal fascia was then carefully approximated with interrupted 0 Vicryl sutures with a UR 6 needle, the deep subcutaneous layers were approximated with interrupted 0 Vicryl sutures on UR 6 the appear subcutaneous layers approximated with interrupted 2-0 Vicryl sutures and the skin closed with a stainless steel staples. Mepilex bandage applied. Patient was then carefully returned to supine position on a stretcher, reactivated and extubated. She was then returned to recovery room in satisfactory condition. ? ?Benjiman Core, PA-C perform the duties of assistant surgeon during this case.  He was present from the beginning of the case to the end of the case assisting in transfer the patient from his stretcher to the OR table and back to the stretcher at the end of the case. Assisted in careful retraction and suction of the laminectomy site delicate neural structures operating under the operating room microscope. He also assisted with closure of the incision from the fascia to the skin applying the dressing. ? ?   ? ?  Basil Dess ? ?04/18/2021, 11:54 AM  ?

## 2021-04-18 NOTE — Discharge Instructions (Addendum)
    No lifting greater than 10 lbs. Avoid bending, stooping and twisting. Walk in house for first week them may start to get out slowly increasing distance up to one mile by 3 weeks post op. Keep incision dry for 3 days, may use tegaderm or similar water impervious dressing.  

## 2021-04-18 NOTE — Anesthesia Postprocedure Evaluation (Signed)
Anesthesia Post Note ? ?Patient: Edwin Cline ? ?Procedure(s) Performed: REDO MICRODISCECTOMY Left L5-S1 (Left: Spine Lumbar) ? ?  ? ?Patient location during evaluation: PACU ?Anesthesia Type: General ?Level of consciousness: awake and alert ?Pain management: pain level controlled ?Vital Signs Assessment: post-procedure vital signs reviewed and stable ?Respiratory status: spontaneous breathing, nonlabored ventilation, respiratory function stable and patient connected to nasal cannula oxygen ?Cardiovascular status: blood pressure returned to baseline and stable ?Postop Assessment: no apparent nausea or vomiting ?Anesthetic complications: no ? ? ?No notable events documented. ? ?Last Vitals:  ?Vitals:  ? 04/18/21 1544 04/18/21 1619  ?BP: (!) 132/97 (!) 151/112  ?Pulse: 65 67  ?Resp: 13 15  ?Temp: 36.6 ?C 36.5 ?C  ?SpO2: 97%   ?  ?Last Pain:  ?Vitals:  ? 04/18/21 1619  ?TempSrc: Oral  ?PainSc:   ? ? ?  ?  ?  ?  ?  ?  ? ?[place] ? ? ? ? ?

## 2021-04-18 NOTE — Brief Op Note (Addendum)
04/18/2021 ? ?11:49 AM ? ?PATIENT:  Edwin Cline  46 y.o. male ? ?PRE-OPERATIVE DIAGNOSIS:  left L5-S1 recurrent herniated nucleus pulposus ? ?POST-OPERATIVE DIAGNOSIS:  * No post-op diagnosis entered * ? ?PROCEDURE:  Procedure(s): ?REDO MICRODISCECTOMY Left L5-S1 (Left) ? ?SURGEON:  Surgeon(s) and Role: ?   * Kerrin Champagne, MD - Primary ? ?PHYSICIAN ASSISTANT: Zonia Kief, PA-C ? ?ANESTHESIA:   local and general, Dr. Chaney Malling ? ?EBL: 50CC ? ?BLOOD ADMINISTERED:none ? ?DRAINS: none  ? ?LOCAL MEDICATIONS USED:  MARCAINE 0.5% 2:1 EXPAREL 1.3%  Amount: 30 ml ? ?SPECIMEN:  No Specimen ? ?DISPOSITION OF SPECIMEN:  N/A ? ?COUNTS:  YES ? ?TOURNIQUET:  * No tourniquets in log * ? ?DICTATION: .Dragon Dictation ? ?PLAN OF CARE: Admit for overnight observation ? ?PATIENT DISPOSITION:  PACU - hemodynamically stable. ?  ?Delay start of Pharmacological VTE agent (>24hrs) due to surgical blood loss or risk of bleeding: yes ? ?COMPLICATION: Dural tear anterior paramedian to the axillary area less than 22mm, repaired with 1/4" square duragen and duraseal fibrin glue.  ? ?

## 2021-04-19 ENCOUNTER — Telehealth: Payer: Self-pay | Admitting: Specialist

## 2021-04-19 ENCOUNTER — Encounter (HOSPITAL_COMMUNITY): Payer: Self-pay | Admitting: Specialist

## 2021-04-19 ENCOUNTER — Other Ambulatory Visit (HOSPITAL_BASED_OUTPATIENT_CLINIC_OR_DEPARTMENT_OTHER): Payer: Self-pay

## 2021-04-19 MED ORDER — DOCUSATE SODIUM 100 MG PO CAPS
100.0000 mg | ORAL_CAPSULE | Freq: Two times a day (BID) | ORAL | 0 refills | Status: DC
  Filled 2021-04-19: qty 100, 50d supply, fill #0

## 2021-04-19 MED ORDER — PANTOPRAZOLE SODIUM 40 MG PO TBEC: 40.0000 mg | DELAYED_RELEASE_TABLET | Freq: Every day | ORAL | Status: DC

## 2021-04-19 MED ORDER — OXYCODONE HCL 5 MG PO TABS
5.0000 mg | ORAL_TABLET | ORAL | 0 refills | Status: DC | PRN
Start: 2021-04-19 — End: 2021-05-02
  Filled 2021-04-19: qty 40, 7d supply, fill #0

## 2021-04-19 MED ORDER — OXYCODONE HCL ER 15 MG PO T12A
15.0000 mg | EXTENDED_RELEASE_TABLET | Freq: Two times a day (BID) | ORAL | 0 refills | Status: DC
  Filled 2021-04-19: qty 14, 7d supply, fill #0

## 2021-04-19 NOTE — Progress Notes (Signed)
? ? ? ?  Subjective: ?1 Day Post-Op Procedure(s) (LRB): ?REDO MICRODISCECTOMY Left L5-S1 (Left) ?Awake, alert and oriented x 4. Pain is mild. He states that he awakens several times throughout night at home with panic and shortness of breath. PACU nurses unable to keep  ?HOB flat due to desaturation. Suggests likely Upper airway obstructive dyspnea or OSA. ?No headache, left leg pain is better.  Stood with assistance of wife early this AM. Voiding . ? ?Patient reports pain as mild.   ? ?Objective:  ? ?VITALS:  Temp:  [97.1 ?F (36.2 ?C)-98.6 ?F (37 ?C)] 97.9 ?F (36.6 ?C) (04/05 7989) ?Pulse Rate:  [49-88] 69 (04/05 0728) ?Resp:  [10-18] 18 (04/05 0728) ?BP: (118-154)/(56-112) 126/83 (04/05 2119) ?SpO2:  [95 %-99 %] 98 % (04/05 0728) ? ?Neurologically intact ?ABD soft ?Neurovascular intact ?Sensation intact distally ?Intact pulses distally ?Dorsiflexion/Plantar flexion intact ?Incision: scant drainage ? ? ?LABS ?Recent Labs  ?  04/17/21 ?1530  ?HGB 15.7  ?WBC 5.3  ?PLT 315  ? ?Recent Labs  ?  04/17/21 ?1530  ?NA 138  ?K 3.7  ?CL 105  ?CO2 25  ?BUN 8  ?CREATININE 1.23  ?GLUCOSE 90  ? ?No results for input(s): LABPT, INR in the last 72 hours. ? ? ?Assessment/Plan: ?1 Day Post-Op Procedure(s) (LRB): ?REDO MICRODISCECTOMY Left L5-S1 (Left) ? ?Advance diet ?Up with therapy ?D/C IV fluids ?Will return at noon and assess if no signs of CSF leak then discharge home ?With reduced activity for 2-3 days.  ? ?Edwin Cline ?04/19/2021, 8:40 AM Patient ID: Edwin Cline, male   DOB: 03-12-1975, 46 y.o.   MRN: [id number] ? ?

## 2021-04-19 NOTE — Telephone Encounter (Signed)
Pt had surgery 4/4 and need a 1 wk post op appt. Please call pt at [phone]. ?

## 2021-04-19 NOTE — Evaluation (Signed)
Physical Therapy Evaluation ?Patient Details ?Name: Edwin Cline ?MRN: [id number] ?DOB: 10/29/1975 ?Today's Date: 04/19/2021 ? ?History of Present Illness ? Pt is a 46 y/o male s/p L5-S1 redo microdiscectomy. Pt also with dural tear. PMH includes lumbar surgery.  ?Clinical Impression ? Pt admitted secondary to problem above with deficits below. Pt requiring min guard for mobility tasks using RW. Noted mild functional weakness in LLE and pt reporting tingling sensation in L foot; MD notified. Educated about back precautions and walking program to perform at home. Anticipate pt will progress well. Recommending outpatient PT once cleared  by MD to address mobility deficits. Will continue to follow acutely.  ?   ? ?Recommendations for follow up therapy are one component of a multi-disciplinary discharge planning process, led by the attending physician.  Recommendations may be updated based on patient status, additional functional criteria and insurance authorization. ? ?Follow Up Recommendations Outpatient PT (once cleared by MD) ? ?  ?Assistance Recommended at Discharge Intermittent Supervision/Assistance  ?Patient can return home with the following ? A little help with bathing/dressing/bathroom;Assist for transportation;Assistance with cooking/housework ? ?  ?Equipment Recommendations Rolling walker (2 wheels)  ?Recommendations for Other Services ?    ?  ?Functional Status Assessment Patient has had a recent decline in their functional status and demonstrates the ability to make significant improvements in function in a reasonable and predictable amount of time.  ? ?  ?Precautions / Restrictions Precautions ?Precautions: Back ?Precaution Booklet Issued: Yes (comment) ?Precaution Comments: Reviewed back precautions with pt. ?Restrictions ?Weight Bearing Restrictions: No  ? ?  ? ?Mobility ? Bed Mobility ?  ?  ?  ?  ?  ?  ?  ?General bed mobility comments: In chair upon entry ?  ? ?Transfers ?Overall transfer level: Needs  assistance ?Equipment used: Rolling walker (2 wheels) ?Transfers: Sit to/from Stand ?Sit to Stand: Min guard ?  ?  ?  ?  ?  ?General transfer comment: Min guard for safety. ?  ? ?Ambulation/Gait ?Ambulation/Gait assistance: Min guard, Supervision ?Gait Distance (Feet): 110 Feet ?Assistive device: None, Rolling walker (2 wheels) ?Gait Pattern/deviations: Step-through pattern, Decreased stride length, Decreased weight shift to left ?Gait velocity: Decreased ?  ?  ?General Gait Details: Mildly unsteady without use of RW. Noted functional weakness in LLE. Educated about walking program to perform at home ? ?Stairs ?  ?  ?  ?  ?  ? ?Wheelchair Mobility ?  ? ?Modified Rankin (Stroke Patients Only) ?  ? ?  ? ?Balance Overall balance assessment: Needs assistance ?Sitting-balance support: No upper extremity supported, Feet supported ?Sitting balance-Leahy Scale: Good ?  ?  ?Standing balance support: No upper extremity supported, Bilateral upper extremity supported ?Standing balance-Leahy Scale: Fair ?Standing balance comment: Able to maintain static standing without UE support ?  ?  ?  ?  ?  ?  ?  ?  ?  ?  ?  ?   ? ? ? ?Pertinent Vitals/Pain Pain Assessment ?Pain Assessment: Faces ?Faces Pain Scale: Hurts even more ?Pain Location: back ?Pain Descriptors / Indicators: Grimacing, Guarding ?Pain Intervention(s): Limited activity within patient's tolerance, Monitored during session, Repositioned  ? ? ?Home Living Family/patient expects to be discharged to:: Private residence ?Living Arrangements: Spouse/significant other ?Available Help at Discharge: Family;Available 24 hours/day ?Type of Home: Other(Comment) (townhome) ?Home Access: Level entry ?  ?  ?Alternate Level Stairs-Number of Steps: flight ?Home Layout: Two level ?Home Equipment: None ?   ?  ?Prior Function Prior Level of Function :  Independent/Modified Independent ?  ?  ?  ?  ?  ?  ?  ?  ?  ? ? ?Hand Dominance  ?   ? ?  ?Extremity/Trunk Assessment  ? Upper Extremity  Assessment ?Upper Extremity Assessment: Defer to OT evaluation ?  ? ?Lower Extremity Assessment ?Lower Extremity Assessment: LLE deficits/detail ?LLE Deficits / Details: Functional weakness noted. Reporting tingling in L foot ?  ? ?Cervical / Trunk Assessment ?Cervical / Trunk Assessment: Back Surgery  ?Communication  ? Communication: No difficulties  ?Cognition Arousal/Alertness: Awake/alert ?Behavior During Therapy: [facility] for tasks assessed/performed ?Overall Cognitive Status: Within Functional Limits for tasks assessed ?  ?  ?  ?  ?  ?  ?  ?  ?  ?  ?  ?  ?  ?  ?  ?  ?  ?  ?  ? ?  ?General Comments   ? ?  ?Exercises    ? ?Assessment/Plan  ?  ?PT Assessment Patient needs continued PT services  ?PT Problem List Decreased strength;Decreased activity tolerance;Decreased balance;Decreased mobility;Decreased knowledge of use of DME;Decreased knowledge of precautions;Pain ? ?   ?  ?PT Treatment Interventions DME instruction;Gait training;Therapeutic activities;Stair training;Functional mobility training;Therapeutic exercise;Balance training;Patient/family education   ? ?PT Goals (Current goals can be found in the Care Plan section)  ?Acute Rehab PT Goals ?Patient Stated Goal: to go home ?PT Goal Formulation: With patient ?Time For Goal Achievement: 05/03/21 ?Potential to Achieve Goals: Good ? ?  ?Frequency Min 5X/week ?  ? ? ?Co-evaluation   ?  ?  ?  ?  ? ? ?  ?AM-PAC PT "6 Clicks" Mobility  ?Outcome Measure Help needed turning from your back to your side while in a flat bed without using bedrails?: None ?Help needed moving from lying on your back to sitting on the side of a flat bed without using bedrails?: A Little ?Help needed moving to and from a bed to a chair (including a wheelchair)?: A Little ?Help needed standing up from a chair using your arms (e.g., wheelchair or bedside chair)?: A Little ?Help needed to walk in hospital room?: A Little ?Help needed climbing 3-5 steps with a railing? : A Little ?6 Click Score:  19 ? ?  ?End of Session Equipment Utilized During Treatment: Gait belt ?Activity Tolerance: Patient tolerated treatment well ?Patient left: with call bell/phone within reach;in chair ?Nurse Communication: Mobility status ?PT Visit Diagnosis: Other abnormalities of gait and mobility (R26.89);Pain ?Pain - part of body:  (back) ?  ? ?Time: 8563-1497 ?PT Time Calculation (min) (ACUTE ONLY): 18 min ? ? ?Charges:   PT Evaluation ?$PT Eval Low Complexity: 1 Low ?  ?  ?   ? ? ?Farley Ly, PT, DPT  ?Acute Rehabilitation Services  ?Pager: [phone] ?Office: [phone] ? ? ?[place] ?04/19/2021, 10:12 AM ? ?

## 2021-04-19 NOTE — Progress Notes (Signed)
PHARMACIST - PHYSICIAN COMMUNICATION ? ?DR:   Otelia Sergeant ? ?CONCERNING: IV to Oral Route Change Policy ? ?RECOMMENDATION: ?This patient is receiving Protonix by the intravenous route.  Based on criteria approved by the Pharmacy and Therapeutics Committee, the intravenous medication(s) is/are being converted to the equivalent oral dose form(s). ? ? ?DESCRIPTION: ?These criteria include: ?The patient is eating (either orally or via tube) and/or has been taking other orally administered medications for a least 24 hours ?The patient has no evidence of active gastrointestinal bleeding or impaired GI absorption (gastrectomy, short bowel, patient on TNA or NPO). ? ?If you have questions about this conversion, please contact the Pharmacy Department  ?[]   [phone] )  ( [phone] ?[]   [phone] )  [facility] ?[x]   [phone] )  [facility] ?[]   [phone] )  [facility] ?[]   [phone] )  [facility]  ? ?( [phone], PharmD, BCIDP, AAHIVP, CPP ?Infectious Disease Pharmacist ?04/19/2021 1:47 PM ? ? ? ? ?

## 2021-04-19 NOTE — Evaluation (Signed)
Occupational Therapy Evaluation ?Patient Details ?Name: Edwin Cline ?MRN: [id number] ?DOB: 10/18/1975 ?Today's Date: 04/19/2021 ? ? ?History of Present Illness Pt is a 46 y/o male s/p L5-S1 redo microdiscectomy. Pt also with dural tear. PMH includes lumbar surgery.  ? ?Clinical Impression ?  ?Patient admitted for the procedure above.  PTA he lives at home with his spouse, who is able to provide any assist needed.  Primary deficit is expected post op pain.  Precautions reviewed, sit/stand light lower body ADL performed.  Patient is hoping to discharge today, but OT placed a couple goals just in case he remains.  No post acute OT is anticipated.    ?   ? ?Recommendations for follow up therapy are one component of a multi-disciplinary discharge planning process, led by the attending physician.  Recommendations may be updated based on patient status, additional functional criteria and insurance authorization.  ? ?Follow Up Recommendations ? No OT follow up  ?  ?Assistance Recommended at Discharge Intermittent Supervision/Assistance  ?Patient can return home with the following   ? ?  ?Functional Status Assessment ? Patient has had a recent decline in their functional status and demonstrates the ability to make significant improvements in function in a reasonable and predictable amount of time.  ?Equipment Recommendations ? None recommended by OT  ?  ?Recommendations for Other Services   ? ? ?  ?Precautions / Restrictions Precautions ?Precautions: Back ?Precaution Booklet Issued: Yes (comment) ?Precaution Comments: Issued by PT, reviewed as needed. ?Restrictions ?Weight Bearing Restrictions: No  ? ?  ? ?Mobility Bed Mobility ?Overal bed mobility: Needs Assistance ?Bed Mobility: Sidelying to Sit, Sit to Sidelying ?  ?Sidelying to sit: Supervision ?  ?  ?Sit to sidelying: Supervision ?  ?  ? ?Transfers ?Overall transfer level: Needs assistance ?  ?Transfers: Sit to/from Stand ?Sit to Stand: Min guard ?  ?  ?  ?  ?  ?  ?   ? ?  ?Balance Overall balance assessment: Needs assistance ?Sitting-balance support: Feet supported ?Sitting balance-Leahy Scale: Good ?  ?  ?Standing balance support: No upper extremity supported ?Standing balance-Leahy Scale: Fair ?  ?  ?  ?  ?  ?  ?  ?  ?  ?  ?  ?  ?   ? ?ADL either performed or assessed with clinical judgement  ? ?ADL   ?  ?  ?  ?  ?  ?  ?  ?  ?  ?  ?Lower Body Dressing: Minimal assistance;Sit to/from stand ?  ?Toilet Transfer: Min guard;Ambulation;Regular Toilet ?  ?  ?  ?  ?  ?  ?   ? ? ? ?Vision Patient Visual Report: No change from baseline ?   ?   ?Perception Perception ?Perception: Not tested ?  ?Praxis Praxis ?Praxis: Not tested ?  ? ?Pertinent Vitals/Pain Pain Assessment ?Faces Pain Scale: Hurts even more ?Pain Location: back ?Pain Descriptors / Indicators: Grimacing, Guarding ?Pain Intervention(s): Monitored during session  ? ? ? ?Hand Dominance Right ?  ?Extremity/Trunk Assessment Upper Extremity Assessment ?Upper Extremity Assessment: Overall WFL for tasks assessed ?  ?Lower Extremity Assessment ?Lower Extremity Assessment: Defer to PT evaluation ?LLE Deficits / Details: Functional weakness noted. Reporting tingling in L foot ?  ?Cervical / Trunk Assessment ?Cervical / Trunk Assessment: Back Surgery ?  ?Communication Communication ?Communication: No difficulties ?  ?Cognition Arousal/Alertness: Awake/alert ?Behavior During Therapy: [facility] for tasks assessed/performed ?Overall Cognitive Status: Within Functional Limits for tasks assessed ?  ?  ?  ?  ?  ?  ?  ?  ?  ?  ?  ?  ?  ?  ?  ?  ?  ?  ?  ?  General Comments   VSS on RA ? ?  ?Exercises   ?  ?Shoulder Instructions    ? ? ?Home Living Family/patient expects to be discharged to:: Private residence ?Living Arrangements: Spouse/significant other ?Available Help at Discharge: Family;Available 24 hours/day ?Type of Home: Other(Comment) ?Home Access: Level entry ?  ?  ?Home Layout: Two level ?Alternate Level Stairs-Number of Steps:  flight ?Alternate Level Stairs-Rails: Right;Left;Can reach both ?Bathroom Shower/Tub: Tub/shower unit ?  ?Bathroom Toilet: Standard ?  ?  ?Home Equipment: None ?  ?  ?  ? ?  ?Prior Functioning/Environment Prior Level of Function : Independent/Modified Independent ?  ?  ?  ?  ?  ?  ?  ?  ?  ? ?  ?  ?OT Problem List: Pain ?  ?   ?OT Treatment/Interventions: Self-care/ADL training;Balance training;Therapeutic activities  ?  ?OT Goals(Current goals can be found in the care plan section) Acute Rehab OT Goals ?Patient Stated Goal: Hoping to go home later today ?OT Goal Formulation: With patient ?Time For Goal Achievement: 05/03/21 ?Potential to Achieve Goals: Good ?ADL Goals ?Pt Will Perform Lower Body Dressing: with modified independence;sit to/from stand ?Pt Will Transfer to Toilet: with modified independence;ambulating;regular height toilet  ?OT Frequency: Min 2X/week ?  ? ?Co-evaluation   ?  ?  ?  ?  ? ?  ?AM-PAC OT "6 Clicks" Daily Activity     ?Outcome Measure Help from another person eating meals?: None ?Help from another person taking care of personal grooming?: None ?Help from another person toileting, which includes using toliet, bedpan, or urinal?: A Little ?Help from another person bathing (including washing, rinsing, drying)?: A Little ?Help from another person to put on and taking off regular upper body clothing?: A Little ?Help from another person to put on and taking off regular lower body clothing?: A Little ?6 Click Score: 20 ?  ?End of Session Nurse Communication: Mobility status ? ?Activity Tolerance: Patient tolerated treatment well ?Patient left: in bed;with call bell/phone within reach;with family/visitor present ? ?OT Visit Diagnosis: Pain  ?              ?Time: [id number] ?OT Time Calculation (min): 20 min ?Charges:  OT General Charges ?$OT Visit: 1 Visit ?OT Evaluation ?$OT Eval Moderate Complexity: 1 Mod ? ?04/19/2021 ? ?RP, OTR/L ? ?Acute Rehabilitation Services ? ?Office:   [phone] ? ? ?Kamoni Depree D Shayana Hornstein ?04/19/2021, 10:51 AM ?

## 2021-04-19 NOTE — Progress Notes (Signed)
Patient ID: Edwin Cline, male   DOB: September 09, 1975, 46 y.o.   MRN: [id number] ?Awake, alert and oriented x 4. Has numbness left buttock left posterior thigh and calf into the left lateral foot consistent with S1 anesthesia, motor is intact and this is likely due toS  S1 root irritation. The dural tear was more medial and beyond the takeoff of the S1 root.  ?Discussed the numbness and the  fact that the nerve to recover will need to grow back from the left lower back to the leg. The motor function is normal but sensory requires more fibers of the nerve to be working to be normal and this may be partly due to acute swelling and the pressure on the nerve moving it for surgery.  ?Incision is dry with some mild swelling no fluctuance. ?PT and OT did well. ? ?Discharge home with 3 in 1 and rolling walker.  ?Return to office in one week.  ?

## 2021-04-20 ENCOUNTER — Other Ambulatory Visit (HOSPITAL_BASED_OUTPATIENT_CLINIC_OR_DEPARTMENT_OTHER): Payer: Self-pay

## 2021-04-21 ENCOUNTER — Other Ambulatory Visit (HOSPITAL_BASED_OUTPATIENT_CLINIC_OR_DEPARTMENT_OTHER): Payer: Self-pay

## 2021-04-25 ENCOUNTER — Other Ambulatory Visit (HOSPITAL_BASED_OUTPATIENT_CLINIC_OR_DEPARTMENT_OTHER): Payer: Self-pay

## 2021-04-26 ENCOUNTER — Other Ambulatory Visit (HOSPITAL_BASED_OUTPATIENT_CLINIC_OR_DEPARTMENT_OTHER): Payer: Self-pay

## 2021-04-26 ENCOUNTER — Ambulatory Visit (INDEPENDENT_AMBULATORY_CARE_PROVIDER_SITE_OTHER): Payer: 59 | Admitting: Surgery

## 2021-04-26 ENCOUNTER — Encounter: Payer: Self-pay | Admitting: Surgery

## 2021-04-26 DIAGNOSIS — Z9889 Other specified postprocedural states: Secondary | ICD-10-CM

## 2021-04-26 MED ORDER — METHYLPREDNISOLONE 4 MG PO TBPK
ORAL_TABLET | ORAL | 0 refills | Status: DC
  Filled 2021-04-26: qty 21, 6d supply, fill #0

## 2021-04-27 ENCOUNTER — Other Ambulatory Visit (HOSPITAL_BASED_OUTPATIENT_CLINIC_OR_DEPARTMENT_OTHER): Payer: Self-pay

## 2021-04-28 ENCOUNTER — Telehealth: Payer: Self-pay | Admitting: Specialist

## 2021-04-28 ENCOUNTER — Other Ambulatory Visit (HOSPITAL_BASED_OUTPATIENT_CLINIC_OR_DEPARTMENT_OTHER): Payer: Self-pay

## 2021-04-28 DIAGNOSIS — I10 Essential (primary) hypertension: Secondary | ICD-10-CM

## 2021-04-28 DIAGNOSIS — M5416 Radiculopathy, lumbar region: Secondary | ICD-10-CM

## 2021-04-28 NOTE — Telephone Encounter (Signed)
Can you advise? I do not see referral in system. ?

## 2021-04-28 NOTE — Telephone Encounter (Signed)
Please call the patient to discuss a outgoing referral to someone that Dr Otelia Sergeant Was talking to him about  ?

## 2021-05-01 ENCOUNTER — Other Ambulatory Visit (HOSPITAL_BASED_OUTPATIENT_CLINIC_OR_DEPARTMENT_OTHER): Payer: Self-pay

## 2021-05-01 ENCOUNTER — Other Ambulatory Visit: Payer: Self-pay | Admitting: Specialist

## 2021-05-01 DIAGNOSIS — M5416 Radiculopathy, lumbar region: Secondary | ICD-10-CM

## 2021-05-02 ENCOUNTER — Other Ambulatory Visit: Payer: Self-pay | Admitting: Specialist

## 2021-05-02 ENCOUNTER — Other Ambulatory Visit (HOSPITAL_BASED_OUTPATIENT_CLINIC_OR_DEPARTMENT_OTHER): Payer: Self-pay

## 2021-05-02 MED ORDER — OXYCODONE HCL 5 MG PO TABS
5.0000 mg | ORAL_TABLET | ORAL | 0 refills | Status: DC | PRN
  Filled 2021-05-02: qty 40, 7d supply, fill #0

## 2021-05-02 NOTE — Addendum Note (Signed)
Addended by: Rogers Seeds on: 05/02/2021 05:07 PM ? ? Modules accepted: Orders ? ?

## 2021-05-03 NOTE — Telephone Encounter (Signed)
Referral to family medicine entered. I tried to reach patient with no answer. ?

## 2021-05-04 ENCOUNTER — Ambulatory Visit (INDEPENDENT_AMBULATORY_CARE_PROVIDER_SITE_OTHER): Payer: 59 | Admitting: Specialist

## 2021-05-04 ENCOUNTER — Encounter: Payer: Self-pay | Admitting: Specialist

## 2021-05-04 ENCOUNTER — Other Ambulatory Visit (HOSPITAL_BASED_OUTPATIENT_CLINIC_OR_DEPARTMENT_OTHER): Payer: Self-pay

## 2021-05-04 VITALS — BP 162/111 | HR 88 | Ht 75.0 in | Wt 225.0 lb

## 2021-05-04 DIAGNOSIS — Z9889 Other specified postprocedural states: Secondary | ICD-10-CM

## 2021-05-04 DIAGNOSIS — M5416 Radiculopathy, lumbar region: Secondary | ICD-10-CM

## 2021-05-04 DIAGNOSIS — G4730 Sleep apnea, unspecified: Secondary | ICD-10-CM

## 2021-05-04 DIAGNOSIS — I10 Essential (primary) hypertension: Secondary | ICD-10-CM

## 2021-05-04 MED ORDER — OXYCODONE HCL 5 MG PO TABS
5.0000 mg | ORAL_TABLET | ORAL | 0 refills | Status: DC | PRN
  Filled 2021-05-04: qty 40, 7d supply, fill #0

## 2021-05-04 MED ORDER — LOSARTAN POTASSIUM 50 MG PO TABS
50.0000 mg | ORAL_TABLET | Freq: Every day | ORAL | 3 refills | Status: DC
  Filled 2021-05-04: qty 30, 30d supply, fill #0

## 2021-05-04 MED ORDER — EPINEPHRINE 0.3 MG/0.3ML IJ SOAJ: 0.3000 mg | INTRAMUSCULAR | 0 refills | Status: DC | PRN

## 2021-05-04 MED ORDER — GABAPENTIN 300 MG PO CAPS
ORAL_CAPSULE | ORAL | 0 refills | Status: DC
  Filled 2021-05-04: qty 81, 30d supply, fill #0

## 2021-05-04 MED ORDER — EPINEPHRINE 0.3 MG/0.3ML IJ SOAJ
0.3000 mg | INTRAMUSCULAR | 0 refills | Status: AC | PRN
Start: 2021-05-04 — End: ?
  Filled 2021-05-04: qty 1, 1d supply, fill #0

## 2021-05-04 NOTE — Progress Notes (Signed)
? ?  Post-Op Visit Note ?  ?Patient: Edwin Cline           ?Date of Birth: 1975-06-14           ?MRN: [id number] ?Visit Date: 05/04/2021 ?PCP: Patient, No Pcp Per (Inactive) ? ? ?Assessment & Plan:2 weeks post op lumbar laminectomies ?Headaches one time ?Left foot drop ?Sensation is decreased left S1.  ?SLR negative ?Motor with left foot drop ?Weak with ambulation left foot DF ? ?Chief Complaint:  ?Chief Complaint  ?Patient presents with  ? Lower Back - Routine Post Op  ?  Redo Microdiscectomy L5-S1---DOS: 04/18/21 ---has good and bad days today is not so good  ? ?Visit Diagnoses: No diagnosis found. ? ?Plan: Avoid frequent bending and stooping  ?No lifting greater than 10 lbs. ?May use ice or moist heat for pain. ?Weight loss is of benefit. ?Best medication for lumbar disc disease is arthritis medications like motrin, celebrex and naprosyn. ?Exercise is important to improve your indurance and does allow people to function better inspite of back pain. ?  ? ?Follow-Up Instructions: No follow-ups on file.  ? ?Orders:  ?No orders of the defined types were placed in this encounter. ? ?No orders of the defined types were placed in this encounter. ? ? ?Imaging: ?No results found. ? ?PMFS History: ?Patient Active Problem List  ? Diagnosis Date Noted  ? HNP (herniated nucleus pulposus), lumbar 04/18/2021  ? Herniated nucleus pulposus, L5-S1 03/13/2021  ? Lumbar radiculopathy 02/15/2021  ? ?Past Medical History:  ?Diagnosis Date  ? Carpal tunnel syndrome   ?  ?Family History  ?Problem Relation Age of Onset  ? Diabetes Father   ?  ?Past Surgical History:  ?Procedure Laterality Date  ? BACK SURGERY  2012  ? LUMBAR LAMINECTOMY/DECOMPRESSION MICRODISCECTOMY Left 04/18/2021  ? Procedure: REDO MICRODISCECTOMY Left L5-S1;  Surgeon: Kerrin Champagne, MD;  Location: [facility];  Service: Orthopedics;  Laterality: Left;  ? ?Social History  ? ?Occupational History  ? Not on file  ?Tobacco Use  ? Smoking status: Every Day  ?  Types: Cigars  ?  Smokeless tobacco: Never  ? Tobacco comments:  ?  Smokes black n milds every day  ?Vaping Use  ? Vaping Use: Never used  ?Substance and Sexual Activity  ? Alcohol use: Not Currently  ? Drug use: No  ? Sexual activity: Not on file  ? ? ? ?

## 2021-05-04 NOTE — Addendum Note (Signed)
Addended by: Basil Dess on: 05/04/2021 02:30 PM ? ? Modules accepted: Orders ? ?

## 2021-05-04 NOTE — Addendum Note (Signed)
Addended by: [name] on: 05/04/2021 02:33 PM ? ? Modules accepted: Orders ? ?

## 2021-05-04 NOTE — Patient Instructions (Addendum)
?  Plan: Avoid frequent bending and stooping  ?No lifting greater than 10 lbs. ?May use ice or moist heat for pain. ?Weight loss is of benefit. ?Best medication for lumbar disc disease is arthritis medications like motrin, celebrex and naprosyn. ?Exercise is important to improve your indurance and does allow people to function better inspite of back pain. ?Oxy IR renewed. ?Ramp gabapentin medication gradually to TID. ?

## 2021-05-05 ENCOUNTER — Encounter (HOSPITAL_COMMUNITY): Payer: Self-pay | Admitting: Specialist

## 2021-05-08 ENCOUNTER — Telehealth: Payer: Self-pay | Admitting: Specialist

## 2021-05-08 NOTE — Telephone Encounter (Signed)
Pt called wondering if he could get oxycodone 10s instead of the 5s. He said the 5s arent working.  ?

## 2021-05-10 ENCOUNTER — Other Ambulatory Visit: Payer: Self-pay | Admitting: Specialist

## 2021-05-10 NOTE — Telephone Encounter (Signed)
Pt called about an update about his refill and asking for 10 mg instead of 5 mg. Please call pt at [phone] ?

## 2021-05-10 NOTE — Telephone Encounter (Signed)
Pt called about medicine again.  ? ?CB [phone]  ?

## 2021-05-11 ENCOUNTER — Other Ambulatory Visit (HOSPITAL_BASED_OUTPATIENT_CLINIC_OR_DEPARTMENT_OTHER): Payer: Self-pay

## 2021-05-11 ENCOUNTER — Ambulatory Visit (INDEPENDENT_AMBULATORY_CARE_PROVIDER_SITE_OTHER): Payer: 59 | Admitting: Family

## 2021-05-11 ENCOUNTER — Encounter: Payer: Self-pay | Admitting: Family

## 2021-05-11 VITALS — BP 126/95 | HR 100 | Ht 74.0 in | Wt 227.4 lb

## 2021-05-11 DIAGNOSIS — I10 Essential (primary) hypertension: Secondary | ICD-10-CM

## 2021-05-11 DIAGNOSIS — R400 Somnolence: Secondary | ICD-10-CM

## 2021-05-11 DIAGNOSIS — G4733 Obstructive sleep apnea (adult) (pediatric): Secondary | ICD-10-CM | POA: Insufficient documentation

## 2021-05-11 MED ORDER — LOSARTAN POTASSIUM-HCTZ 50-12.5 MG PO TABS: 1.0000 | ORAL_TABLET | Freq: Every day | ORAL | 1 refills | Status: DC

## 2021-05-11 MED ORDER — OXYCODONE HCL 5 MG PO TABS
5.0000 mg | ORAL_TABLET | ORAL | 0 refills | Status: DC | PRN
  Filled 2021-05-11: qty 40, 7d supply, fill #0

## 2021-05-11 NOTE — Telephone Encounter (Signed)
See other message

## 2021-05-11 NOTE — Telephone Encounter (Signed)
Printed for Dr. Nitka ? ?

## 2021-05-11 NOTE — Patient Instructions (Signed)
Welcome to [facility]! It was a pleasure meeting you today. ? ?As discussed, I have sent a referral for you to be tested for sleep apnea. The clinic will call you directly. ?When you need a refill on your blood pressure medicine let me know and I will send a new one with the mild diuretic added. ?Drink at least 2 liters of water daily - AVOID the energy drinks, sports drinks, lemonades/koolaids as these have a lot of sodium and make you swell and make your blood pressure go higher. ? ?Please schedule a 3 month follow up visit today with fasting labs. ? ? ?PLEASE NOTE: ? ?If you had any LAB tests please let us know if you have not heard back within a few days. You may see your results on MyChart before we have a chance to review them but we will give you a call once they are reviewed by [place]. If we ordered any REFERRALS today, please let us know if you have not heard from their office within the next week.  ?Let us know through MyChart if you are needing REFILLS, or have your pharmacy send [place] the request. You can also use MyChart to communicate with me or any office staff. ? ?Please try these tips to maintain a healthy lifestyle: ? ?Eat most of your calories during the day when you are active. Eliminate processed foods including packaged sweets (pies, cakes, cookies), reduce intake of potatoes, white bread, white pasta, and white rice. Look for whole grain options, oat flour or almond flour. ? ?Each meal should contain half fruits/vegetables, one quarter protein, and one quarter carbs (no bigger than a computer mouse). ? ?Cut down on sweet beverages. This includes juice, soda, and sweet tea. Also watch fruit intake, though this is a healthier sweet option, it still contains natural sugar! Limit to 3 servings daily. ? ?Drink at least 1 glass of water with each meal and aim for at least 8 glasses per day ? ?Exercise at least 150 minutes every week.  ? ?

## 2021-05-11 NOTE — Progress Notes (Signed)
? ?New Patient Office Visit ? ?Subjective:  ?Patient ID: Edwin Cline, Edwin Cline    DOB: [birth year]  Age: 46 y.o. MRN: [id number] ? ?CC:  ?Chief Complaint  ?Patient presents with  ? Establish Care  ? Sleep Apnea  ?  Pt states at night when he's sleeping he jumps up out his sleep and choking/gagging for years.   ? ?HPI ?Edwin Cline presents for establishing care. ? ?Sleep apnea symptoms:  pt reports having for years where he wakes up during the night gagging and choking to breathe. wife present and also reports loud snoring and witnessing him not breathing. Also reports being very tired during the day and drinking a lot of energy drinks to stay awake and work. ?Hypertension: Patient is currently maintained on the following medications for blood pressure: Losartan ?Failed meds include: none. ?Patient reports good compliance with blood pressure medications. ?Patient denies chest pain, headaches, shortness of breath or swelling. ?Last 3 blood pressure readings in our office are as follows: ?BP Readings from Last 3 Encounters:  ?05/11/21 (!) 126/95  ?05/04/21 (!) 162/111  ?04/19/21 126/83  ?  ?Assessment & Plan:  ? ?Problem List Items Addressed This Visit   ? ?  ? Cardiovascular and Mediastinum  ? Essential hypertension  ?  New- recently started on Losartan. Pt also reports mild edema in hands & legs, will add HCTZ today. pt advised on use & SE. Also discussed in length importance of 2L water daily and low sodium diet. f/u in 65mo for fasting labs & visit. ? ?  ?  ? Relevant Medications  ? losartan-hydrochlorothiazide (HYZAAR) 50-12.5 MG tablet  ?  ? Other  ? Daytime somnolence - Primary  ?  Chronic - pt reports having for years where he wakes up during the night gagging and choking to breathe. wife present and also reports loud snoring and witnessing him not breathing. Also reports being very tired during the day and drinking a lot of energy drinks to stay awake and work. Sending referral to sleep clinic. ? ?  ?  ?  Relevant Orders  ? Ambulatory referral to Sleep Studies  ? ? ?Past Medical History:  ?Diagnosis Date  ? Carpal tunnel syndrome   ? ? ?Past Surgical History:  ?Procedure Laterality Date  ? BACK SURGERY  2012  ? LUMBAR LAMINECTOMY/DECOMPRESSION MICRODISCECTOMY Left 04/18/2021  ? Procedure: REDO MICRODISCECTOMY Left L5-S1;  Surgeon: Kerrin Champagne, MD;  Location: [facility] OR;  Service: Orthopedics;  Laterality: Left;  ? ? ?Objective:  ? ?Today's Vitals: BP (!) 126/95 (BP Location: Left Arm, Patient Position: Sitting, Cuff Size: Large)   Pulse 100   Ht 6\' 2"  (1.88 m)   Wt 227 lb 6 oz (103.1 kg)   SpO2 97%   BMI 29.19 kg/m?  ? ?Physical Exam ?Vitals and nursing note reviewed.  ?Constitutional:   ?   General: He is not in acute distress. ?   Appearance: Normal appearance.  ?HENT:  ?   Head: Normocephalic.  ?Cardiovascular:  ?   Rate and Rhythm: Normal rate and regular rhythm.  ?Pulmonary:  ?   Effort: Pulmonary effort is normal.  ?   Breath sounds: Normal breath sounds.  ?Musculoskeletal:     ?   General: Normal range of motion.  ?   Cervical back: Normal range of motion.  ?Skin: ?   General: Skin is warm and dry.  ?Neurological:  ?   Mental Status: He is alert and oriented to person, place, and time.  ?  Psychiatric:     ?   Mood and Affect: Mood normal.  ? ? ?Outpatient Encounter Medications as of 05/11/2021  ?Medication Sig  ? EPINEPHrine 0.3 mg/0.3 mL IJ SOAJ injection Inject 0.3 mg into the muscle as needed for anaphylaxis.  ? gabapentin (NEURONTIN) 300 MG capsule Take 1 capsule (300 mg total) by mouth at bedtime for 3 days, THEN 1 capsule (300 mg total) 2 (two) times daily for 3 days, THEN 1 capsule (300 mg total) 3 (three) times daily for 24 days.  ? losartan-hydrochlorothiazide (HYZAAR) 50-12.5 MG tablet Take 1 tablet by mouth daily.  ? [DISCONTINUED] cyclobenzaprine (FLEXERIL) 10 MG tablet Take 1 tablet (10 mg total) by mouth 2 (two) times daily as needed for muscle spasms.  ? [DISCONTINUED] docusate sodium (COLACE)  100 MG capsule Take 1 capsule (100 mg total) by mouth 2 (two) times daily for 5 days.  ? [DISCONTINUED] lidocaine (LIDODERM) 5 % Place 1 patch onto the skin daily. Remove & Discard patch within 12 hours or as directed by MD  ? [DISCONTINUED] losartan (COZAAR) 50 MG tablet Take 1 tablet (50 mg total) by mouth daily.  ? [DISCONTINUED] methylPREDNISolone (MEDROL) 4 MG TBPK tablet Take as directed on pack for 6 days.  ? [DISCONTINUED] oxyCODONE (OXY IR/ROXICODONE) 5 MG immediate release tablet Take 1 tablet (5 mg total) by mouth every 4 (four) hours as needed for moderate pain ((score 4 to 6)).  ? ?No facility-administered encounter medications on file as of 05/11/2021.  ? ? ?Follow-up: Return in about 3 months (around 08/10/2021) for with fasting labs - HTN.  ? ?Dulce Sellar, NP ? ?

## 2021-05-11 NOTE — Telephone Encounter (Addendum)
I called and advised that Dr. Louanne Skye is not going to give him anything stronger than the Oxycodone 5mg , and he requested a refill, this request was forwarded to Dr. Louanne Skye ?

## 2021-05-11 NOTE — Telephone Encounter (Signed)
I have given the info to Dr. Otelia Sergeant and he has not addressed in yet ? ?

## 2021-05-14 ENCOUNTER — Encounter: Payer: Self-pay | Admitting: Family

## 2021-05-14 NOTE — Assessment & Plan Note (Addendum)
New- recently started on Losartan. Pt also reports mild edema in hands & legs, will add HCTZ today. pt advised on use & SE. Also discussed in length importance of 2L water daily and low sodium diet. f/u in 7mo for fasting labs & visit. ?

## 2021-05-14 NOTE — Assessment & Plan Note (Signed)
Chronic - pt reports having for years where he wakes up during the night gagging and choking to breathe. wife present and also reports loud snoring and witnessing him not breathing. Also reports being very tired during the day and drinking a lot of energy drinks to stay awake and work. Sending referral to sleep clinic. ?

## 2021-05-17 ENCOUNTER — Encounter: Payer: Self-pay | Admitting: Specialist

## 2021-05-17 ENCOUNTER — Ambulatory Visit (INDEPENDENT_AMBULATORY_CARE_PROVIDER_SITE_OTHER): Payer: 59 | Admitting: Specialist

## 2021-05-17 ENCOUNTER — Other Ambulatory Visit (HOSPITAL_BASED_OUTPATIENT_CLINIC_OR_DEPARTMENT_OTHER): Payer: Self-pay

## 2021-05-17 VITALS — BP 144/102 | HR 86 | Ht 74.0 in | Wt 227.5 lb

## 2021-05-17 DIAGNOSIS — Z9889 Other specified postprocedural states: Secondary | ICD-10-CM

## 2021-05-17 DIAGNOSIS — G4730 Sleep apnea, unspecified: Secondary | ICD-10-CM

## 2021-05-17 DIAGNOSIS — M5416 Radiculopathy, lumbar region: Secondary | ICD-10-CM

## 2021-05-17 MED ORDER — HYDROCODONE-ACETAMINOPHEN 7.5-325 MG PO TABS
1.0000 | ORAL_TABLET | Freq: Four times a day (QID) | ORAL | 0 refills | Status: DC | PRN
  Filled 2021-05-17: qty 30, 8d supply, fill #0

## 2021-05-17 NOTE — Progress Notes (Signed)
? ?  Post-Op Visit Note ?  ?Patient: Edwin Cline           ?Date of Birth: March 07, 1975           ?MRN: [id number] ?Visit Date: 05/17/2021 ?PCP: Dulce Sellar, NP ? ? ?Assessment & Plan:4 weeks post op Microdiscectomy, ? 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 He is having intermittant head aches. HHN seeing him at home for exercises.  ?Exam incision is healed with swelling and some subcutaneous fluctuance. ?I performed a local anesthestic with 1cc lidocaine and aspirated a small amount of blood from the midline, no purulence and no thin clear fluid as would be seen is persistent CSF leak.  ?Legs motor is normal ?Sensory intact ?SLR with mild pain bilaterally. ?Chief Complaint:  ?Chief Complaint  ?Patient presents with  ? Lower Back - Routine Post Op  ? ?Visit Diagnoses:  ?1. Status post lumbar laminectomy   ?2. Lumbar radiculopathy   ?3. Sleep apnea in adult   ? ? ?Plan:Avoid frequent bending and stooping  ?No lifting greater than 10 lbs. ?May use ice or moist heat for pain. ?Weight loss is of benefit. ?Best medication for lumbar disc disease is arthritis medications like motrin, celebrex and naprosyn. ?Exercise is important to improve your indurance and does allow people to function better inspite of back pain. ? Return in one week to reassess the incision and observe. Expect a bruise or hematoma will eventually resolve its swelling.  ?  ? ?Follow-Up Instructions: No follow-ups on file.  ? ?Orders:  ?No orders of the defined types were placed in this encounter. ? ?No orders of the defined types were placed in this encounter. ? ? ?Imaging: ?No results found. ? ?PMFS History: ?Patient Active Problem List  ? Diagnosis Date Noted  ? Essential hypertension 05/11/2021  ? Daytime somnolence 05/11/2021  ? HNP (herniated nucleus pulposus), lumbar 04/18/2021  ? Herniated nucleus pulposus, L5-S1 03/13/2021  ? Lumbar radiculopathy 02/15/2021  ? ?Past Medical History:  ?Diagnosis Date  ? Carpal  tunnel syndrome   ?  ?Family History  ?Problem Relation Age of Onset  ? Diabetes Father   ?  ?Past Surgical History:  ?Procedure Laterality Date  ? BACK SURGERY  2012  ? LUMBAR LAMINECTOMY/DECOMPRESSION MICRODISCECTOMY Left 04/18/2021  ? Procedure: REDO MICRODISCECTOMY Left L5-S1;  Surgeon: Kerrin Champagne, MD;  Location: [facility] OR;  Service: Orthopedics;  Laterality: Left;  ? ?Social History  ? ?Occupational History  ? Not on file  ?Tobacco Use  ? Smoking status: Former  ?  Types: Cigars  ? Smokeless tobacco: Former  ? Tobacco comments:  ?  Smokes black n milds every day  ?Vaping Use  ? Vaping Use: Never used  ?Substance and Sexual Activity  ? Alcohol use: Not Currently  ? Drug use: No  ? Sexual activity: Yes  ? ? ? ?

## 2021-05-17 NOTE — Patient Instructions (Signed)
Avoid frequent bending and stooping  ?No lifting greater than 10 lbs. ?May use ice or moist heat for pain. ?Weight loss is of benefit. ?Best medication for lumbar disc disease is arthritis medications like motrin, celebrex and naprosyn. ?Exercise is important to improve your indurance and does allow people to function better inspite of back pain. ? Return in one week to reassess the incision and observe. Expect a bruise or hematoma will eventually resolve its swelling.  ?

## 2021-05-25 ENCOUNTER — Ambulatory Visit (INDEPENDENT_AMBULATORY_CARE_PROVIDER_SITE_OTHER): Payer: 59 | Admitting: Specialist

## 2021-05-25 ENCOUNTER — Other Ambulatory Visit (HOSPITAL_BASED_OUTPATIENT_CLINIC_OR_DEPARTMENT_OTHER): Payer: Self-pay

## 2021-05-25 ENCOUNTER — Encounter: Payer: Self-pay | Admitting: Specialist

## 2021-05-25 VITALS — BP 139/98 | HR 71 | Ht 74.0 in | Wt 227.5 lb

## 2021-05-25 DIAGNOSIS — Z9889 Other specified postprocedural states: Secondary | ICD-10-CM

## 2021-05-25 MED ORDER — HYDROCODONE-ACETAMINOPHEN 7.5-325 MG PO TABS
1.0000 | ORAL_TABLET | Freq: Every evening | ORAL | 0 refills | Status: DC
  Filled 2021-05-25: qty 30, 15d supply, fill #0

## 2021-05-25 MED ORDER — ETODOLAC 400 MG PO TABS
400.0000 mg | ORAL_TABLET | Freq: Two times a day (BID) | ORAL | 0 refills | Status: DC
  Filled 2021-05-25: qty 40, 20d supply, fill #0

## 2021-05-25 NOTE — Progress Notes (Signed)
? ?  Post-Op Visit Note ?  ?Patient: Edwin Cline           ?Date of Birth: 1975/03/29           ?MRN: [id number] ?Visit Date: 05/25/2021 ?PCP: Dulce Sellar, NP ? ? ?Assessment & Plan: 5 weeks post op L5-S1 hemilaminectomy ?Works driving truck ?Needs some med at night to relieve pain taking hydrocodone one at a time. ?No bowel or bladder difficulty ?Left hamstring is tight ?Motor with some minimal DF weakness. ? ? ?Chief Complaint:  ?Chief Complaint  ?Patient presents with  ? Lower Back - Follow-up, Routine Post Op, Wound Check  ? ?Visit Diagnoses: No diagnosis found. ? ?Plan: Avoid frequent bending and stooping  ?No lifting greater than 10 lbs. ?May use ice or moist heat for pain. ?Weight loss is of benefit. ?Best medication for lumbar disc disease is arthritis medications like motrin, celebrex and naprosyn. ?Exercise is important to improve your indurance and does allow people to function better inspite of back pain. ? ?Follow-Up Instructions: No follow-ups on file.  ? ?Orders:  ?No orders of the defined types were placed in this encounter. ? ?No orders of the defined types were placed in this encounter. ? ? ?Imaging: ?No results found. ? ?PMFS History: ?Patient Active Problem List  ? Diagnosis Date Noted  ? Essential hypertension 05/11/2021  ? Daytime somnolence 05/11/2021  ? HNP (herniated nucleus pulposus), lumbar 04/18/2021  ? Herniated nucleus pulposus, L5-S1 03/13/2021  ? Lumbar radiculopathy 02/15/2021  ? ?Past Medical History:  ?Diagnosis Date  ? Carpal tunnel syndrome   ?  ?Family History  ?Problem Relation Age of Onset  ? Diabetes Father   ?  ?Past Surgical History:  ?Procedure Laterality Date  ? BACK SURGERY  2012  ? LUMBAR LAMINECTOMY/DECOMPRESSION MICRODISCECTOMY Left 04/18/2021  ? Procedure: REDO MICRODISCECTOMY Left L5-S1;  Surgeon: Kerrin Champagne, MD;  Location: [facility] OR;  Service: Orthopedics;  Laterality: Left;  ? ?Social History  ? ?Occupational History  ? Not on file  ?Tobacco Use  ? Smoking  status: Former  ?  Types: Cigars  ? Smokeless tobacco: Former  ? Tobacco comments:  ?  Smokes black n milds every day  ?Vaping Use  ? Vaping Use: Never used  ?Substance and Sexual Activity  ? Alcohol use: Not Currently  ? Drug use: No  ? Sexual activity: Yes  ? ? ? ?

## 2021-05-25 NOTE — Patient Instructions (Signed)
Avoid frequent bending and stooping  No lifting greater than 10 lbs. May use ice or moist heat for pain. Weight loss is of benefit. Best medication for lumbar disc disease is arthritis medications like motrin, celebrex and naprosyn. Exercise is important to improve your indurance and does allow people to function better inspite of back pain.   

## 2021-05-26 ENCOUNTER — Other Ambulatory Visit (HOSPITAL_BASED_OUTPATIENT_CLINIC_OR_DEPARTMENT_OTHER): Payer: Self-pay

## 2021-06-05 ENCOUNTER — Other Ambulatory Visit (HOSPITAL_BASED_OUTPATIENT_CLINIC_OR_DEPARTMENT_OTHER): Payer: Self-pay

## 2021-06-09 ENCOUNTER — Other Ambulatory Visit (HOSPITAL_BASED_OUTPATIENT_CLINIC_OR_DEPARTMENT_OTHER): Payer: Self-pay

## 2021-06-09 ENCOUNTER — Other Ambulatory Visit: Payer: Self-pay | Admitting: Specialist

## 2021-06-13 ENCOUNTER — Other Ambulatory Visit (HOSPITAL_BASED_OUTPATIENT_CLINIC_OR_DEPARTMENT_OTHER): Payer: Self-pay

## 2021-06-13 ENCOUNTER — Other Ambulatory Visit: Payer: Self-pay | Admitting: Specialist

## 2021-06-13 MED ORDER — HYDROCODONE-ACETAMINOPHEN 7.5-325 MG PO TABS
1.0000 | ORAL_TABLET | Freq: Every evening | ORAL | 0 refills | Status: DC
  Filled 2021-06-13: qty 30, 15d supply, fill #0

## 2021-06-13 NOTE — Telephone Encounter (Signed)
Patient called advised he is out of pain medicine. Please see previous note.    Patient said he had surgery 04/18/2021. The number to contact patient is [phone]

## 2021-06-14 ENCOUNTER — Other Ambulatory Visit: Payer: Self-pay | Admitting: Specialist

## 2021-06-14 MED ORDER — ACETAMINOPHEN-CODEINE 300-30 MG PO TABS
1.0000 | ORAL_TABLET | Freq: Three times a day (TID) | ORAL | 0 refills | Status: DC | PRN
Start: 2021-06-14 — End: 2021-11-13
  Filled 2021-06-14: qty 30, 10d supply, fill #0

## 2021-06-15 ENCOUNTER — Other Ambulatory Visit (HOSPITAL_BASED_OUTPATIENT_CLINIC_OR_DEPARTMENT_OTHER): Payer: Self-pay

## 2021-06-20 NOTE — Discharge Summary (Signed)
Patient ID: Edwin Cline MRN: [id number] DOB/AGE: 48-Dec-1977 45 y.o.  Admit date: 04/18/2021 Discharge date: 04/19/2021  Admission Diagnoses:  Principal Problem:   Herniated nucleus pulposus, L5-S1 Active Problems:   Lumbar radiculopathy   HNP (herniated nucleus pulposus), lumbar   Discharge Diagnoses:  Principal Problem:   Herniated nucleus pulposus, L5-S1 Active Problems:   Lumbar radiculopathy   HNP (herniated nucleus pulposus), lumbar  status post Procedure(s): REDO MICRODISCECTOMY Left L5-S1  Past Medical History:  Diagnosis Date   Carpal tunnel syndrome     Surgeries: Procedure(s): REDO MICRODISCECTOMY Left L5-S1 on 04/18/2021   Consultants:   Discharged Condition: Improved  Hospital Course: Edwin Cline is an 46 y.o. male who was admitted 04/18/2021 for operative treatment of Herniated nucleus pulposus, L5-S1. Patient failed conservative treatments (please see the history and physical for the specifics) and had severe unremitting pain that affects sleep, daily activities and work/hobbies. After pre-op clearance, the patient was taken to the operating room on 04/18/2021 and underwent  Procedure(s): REDO MICRODISCECTOMY Left L5-S1.    Patient was given perioperative antibiotics:  Anti-infectives (From admission, onward)    Start     Dose/Rate Route Frequency Ordered Stop   04/18/21 1800  ceFAZolin (ANCEF) IVPB 2g/100 mL premix        2 g 200 mL/hr over 30 Minutes Intravenous Every 8 hours 04/18/21 1619 04/19/21 0659   04/18/21 0800  ceFAZolin (ANCEF) IVPB 2g/100 mL premix        2 g 200 mL/hr over 30 Minutes Intravenous On call to O.R. 04/18/21 0745 04/18/21 1050        Patient was given sequential compression devices and early ambulation to prevent DVT.   Patient benefited maximally from hospital stay and there were no complications. At the time of discharge, the patient was urinating/moving their bowels without difficulty, tolerating a regular diet, pain is  controlled with oral pain medications and they have been cleared by PT/OT.   Recent vital signs: No data found.   Recent laboratory studies: No results for input(s): WBC, HGB, HCT, PLT, NA, K, CL, CO2, BUN, CREATININE, GLUCOSE, INR, CALCIUM in the last 72 hours.  Invalid input(s): PT, 2   Discharge Medications:   Allergies as of 04/19/2021       Reactions   Shrimp Flavor [flavoring Agent] Hives        Medication List     STOP taking these medications    gabapentin 300 MG capsule Commonly known as: NEURONTIN   meloxicam 7.5 MG tablet Commonly known as: Mobic   oxyCODONE-acetaminophen 10-325 MG tablet Commonly known as: Percocet        Diagnostic Studies: No results found.  Discharge Instructions     Call MD / Call 911   Complete by: As directed    If you experience chest pain or shortness of breath, CALL 911 and be transported to the hospital emergency room.  If you develope a fever above 101 F, pus (white drainage) or increased drainage or redness at the wound, or calf pain, call your surgeon's office.   Constipation Prevention   Complete by: As directed    Drink plenty of fluids.  Prune juice may be helpful.  You may use a stool softener, such as Colace (over the counter) 100 mg twice a day.  Use MiraLax (over the counter) for constipation as needed.   Diet - low sodium heart healthy   Complete by: As directed    Discharge instructions   Complete by: As  directed    No lifting greater than 10 lbs. Avoid bending, stooping and twisting. Walk in house for first week them may start to get out slowly increasing distance up to one mile by 3 weeks post op. Keep incision dry for 3 days, may use tegaderm or similar water impervious dressing.   Increase activity slowly as tolerated   Complete by: As directed    Post-operative opioid taper instructions:   Complete by: As directed    POST-OPERATIVE OPIOID TAPER INSTRUCTIONS: It is important to wean off of your opioid  medication as soon as possible. If you do not need pain medication after your surgery it is ok to stop day one. Opioids include: Codeine, Hydrocodone(Norco, Vicodin), Oxycodone(Percocet, oxycontin) and hydromorphone amongst others.  Long term and even short term use of opiods can cause: Increased pain response Dependence Constipation Depression Respiratory depression And more.  Withdrawal symptoms can include Flu like symptoms Nausea, vomiting And more Techniques to manage these symptoms Hydrate well Eat regular healthy meals Stay active Use relaxation techniques(deep breathing, meditating, yoga) Do Not substitute Alcohol to help with tapering If you have been on opioids for less than two weeks and do not have pain than it is ok to stop all together.  Plan to wean off of opioids This plan should start within one week post op of your joint replacement. Maintain the same interval or time between taking each dose and first decrease the dose.  Cut the total daily intake of opioids by one tablet each day Next start to increase the time between doses. The last dose that should be eliminated is the evening dose.           Follow-up Information     Kerrin Champagne, MD Follow up in 1 week(s).   Specialty: Orthopedic Surgery Contact information: [address] [place] [address] [phone]         Care, [facility]. Call.   Specialty: Home Health Services Why: home health PT services will be provided by [facility] Contact information: [address] [place] [address] [phone]                 Discharge Plan:  discharge to home  Disposition:     Signed: Zonia Kief  06/20/2021, 9:26 AM

## 2021-06-21 NOTE — Progress Notes (Signed)
46 year old black male who is 1 week status post left redo microdiscectomy at L5-S1 returns.  Feels like pain is getting gradually worse within the past few days.  Has numbness and tingling in the left leg.  States that he has not been compliant with the no driving restrictions.  Exam Wound looks good.  No drainage or signs of infection.  Patient is neurologically intact.  Plan I stressed to patient the importance of being compliant with our postop instructions.  He absolutely should not be driving until at least 6 weeks postop and he is cleared by Dr. Otelia Sergeant to do so.  I sent in a prednisone taper.  Follow with Dr. Otelia Sergeant in 1 week for recheck.

## 2021-06-22 ENCOUNTER — Other Ambulatory Visit (HOSPITAL_BASED_OUTPATIENT_CLINIC_OR_DEPARTMENT_OTHER): Payer: Self-pay

## 2021-06-28 ENCOUNTER — Encounter: Payer: 59 | Admitting: Specialist

## 2021-07-06 ENCOUNTER — Ambulatory Visit: Payer: 59 | Admitting: Neurology

## 2021-07-06 ENCOUNTER — Encounter: Payer: Self-pay | Admitting: Neurology

## 2021-07-06 VITALS — BP 132/88 | HR 80 | Ht 75.0 in | Wt 229.4 lb

## 2021-07-06 DIAGNOSIS — R0683 Snoring: Secondary | ICD-10-CM

## 2021-07-06 DIAGNOSIS — R519 Headache, unspecified: Secondary | ICD-10-CM

## 2021-07-06 DIAGNOSIS — R351 Nocturia: Secondary | ICD-10-CM

## 2021-07-06 DIAGNOSIS — E663 Overweight: Secondary | ICD-10-CM

## 2021-07-06 DIAGNOSIS — G4719 Other hypersomnia: Secondary | ICD-10-CM

## 2021-07-06 DIAGNOSIS — R0681 Apnea, not elsewhere classified: Secondary | ICD-10-CM

## 2021-07-06 NOTE — Patient Instructions (Signed)

## 2021-07-06 NOTE — Progress Notes (Signed)
Subjective:    Patient ID: Edwin Cline is a 46 y.o. male.  HPI    Edwin Foley, MD, PhD [facility] [address] [address] [place], [place] [address]  Dear Judeth Cornfield,   I saw your patient, Edwin Cline, upon your kind request, in my sleep clinic today, for initial consultation of her sleep disorder, in particular, concern for underlying obstructive sleep apnea. The patient is unaccompanied today.  As you know, Edwin Cline is a 46 year old right-handed gentleman with an underlying medical history of lumbar radiculopathy, status post low back surgery, carpal tunnel syndrome, hypertension, and overweight state, who reports snoring and excessive daytime somnolence as well as witnessed apneas per wife's report.  I reviewed your office note from 05/11/2021.  His Epworth sleepiness score is 19 out of 24, fatigue severity score is 32 out of 63.  He had recent back surgery and was told by Dr. Otelia Sergeant that he had gasping events or apneas while in recovery.  He has woken up with a sense of panic and gasping and unable to breathe properly.  He is at times scared to go back to sleep at night.  He is going to start a new job next week.  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  His work will be from 5 AM to up to 3 PM.  He generally goes to bed between 9 and 9:30 PM but will go to bed earlier when he starts his job, between 7:30 PM and 8 PM with a rise time of 3:45 AM.  He lives with his wife and his brother.  He has 2 children, a 78 year old daughter who lives on her own and a 35 year old son who lives with his mom and stays with patient on the weekends typically.  Patient is a non-smoker of cigarettes and stopped smoking cigars about a month ago.  Of note, he also stopped using his blood pressure medicine about a month ago and has not notified you yet.  He is no longer oxycodone or fentanyl.  He drinks  caffeine in the form of energy drinks about 2/day.  He drinks alcohol in the form of wine occasionally, not daily.  His weight has been fluctuating, recently more stable.    His Past Medical History Is Significant For: Past Medical History:  Diagnosis Date   Carpal tunnel syndrome     His Past Surgical History Is Significant For: Past Surgical History:  Procedure Laterality Date   BACK SURGERY  2012   LUMBAR LAMINECTOMY/DECOMPRESSION MICRODISCECTOMY Left 04/18/2021   Procedure: REDO MICRODISCECTOMY Left L5-S1;  Surgeon: Kerrin Champagne, MD;  Location: [facility];  Service: Orthopedics;  Laterality: Left;    His Family History Is Significant For: Family History  Problem Relation Age of Onset   Diabetes Father    Sleep apnea Neg Hx     His Social History Is Significant For: Social History   Socioeconomic History   Marital status: Married    Spouse name: Not on file   Number of children: 2   Years of education: Not on file   Highest education level: Not on file  Occupational History   Not on file  Tobacco Use   Smoking status: Former    Types: Cigars   Smokeless tobacco: Former   Tobacco comments:    Smokes black n milds every day  Vaping Use   Vaping Use:  Never used  Substance and Sexual Activity   Alcohol use: Not Currently   Drug use: No   Sexual activity: Yes  Other Topics Concern   Not on file  Social History Narrative   Not on file   Social Determinants of Health   Financial Resource Strain: Not on file  Food Insecurity: Not on file  Transportation Needs: Not on file  Physical Activity: Not on file  Stress: Not on file  Social Connections: Not on file    His Allergies Are:  Allergies  Allergen Reactions   Shrimp Flavor [Flavoring Agent] Hives  :   His Current Medications Are:  Outpatient Encounter Medications as of 07/06/2021  Medication Sig   EPINEPHrine 0.3 mg/0.3 mL IJ SOAJ injection Inject 0.3 mg into the muscle as needed for anaphylaxis.    acetaminophen-codeine (TYLENOL #3) 300-30 MG tablet Take 1 tablet by mouth every 8 (eight) hours as needed for moderate pain (stopping use of narcotics).   etodolac (LODINE) 400 MG tablet Take 1 tablet (400 mg total) by mouth 2 (two) times daily. (Patient not taking: Reported on 07/06/2021)   gabapentin (NEURONTIN) 300 MG capsule Take 1 capsule (300 mg total) by mouth at bedtime for 3 days, THEN 1 capsule (300 mg total) 2 (two) times daily for 3 days, THEN 1 capsule (300 mg total) 3 (three) times daily for 24 days.   losartan-hydrochlorothiazide (HYZAAR) 50-12.5 MG tablet Take 1 tablet by mouth daily. (Patient not taking: Reported on 07/06/2021)   oxyCODONE (OXY IR/ROXICODONE) 5 MG immediate release tablet Take 1 tablet (5 mg total) by mouth every 4 (four) hours as needed for moderate pain ((score 4 to 6)).   No facility-administered encounter medications on file as of 07/06/2021.  :   Review of Systems:  Out of a complete 14 point review of systems, all are reviewed and negative with the exception of these symptoms as listed below:  Review of Systems  Neurological:        Pt is here for sleep consult  Pt  snores,fatigue,headaches ,hypertension. Pt denies sleep study and CPAP machine    ESS:19 FSS:32    Objective:  Neurological Exam  Physical Exam Physical Examination:   Vitals:   07/06/21 1524  BP: 132/88  Pulse: 80    General Examination: The patient is a very pleasant 46 y.o. male in no acute distress. He appears well-developed and well-nourished and well groomed.   HEENT: Normocephalic, atraumatic, pupils are equal, round and reactive to light, extraocular tracking is good without limitation to gaze excursion or nystagmus noted. Hearing is grossly intact. Face is symmetric with normal facial animation. Speech is clear with no dysarthria noted. There is no hypophonia. There is no lip, neck/head, jaw or voice tremor. Neck is supple with full range of passive and active motion.  There are no carotid bruits on auscultation. Oropharynx exam reveals: mild mouth dryness, adequate dental hygiene and moderate airway crowding, due to tonsillar size of about 1-2+, larger uvula.  Mallampati class III.  Neck circumference of 18-1/4 inches.  He has a minimal overbite.  Tongue protrudes centrally and palate elevates symmetrically.   Chest: Clear to auscultation without wheezing, rhonchi or crackles noted.  Heart: S1+S2+0, regular and normal without murmurs, rubs or gallops noted.   Abdomen: Soft, non-tender and non-distended with normal bowel sounds appreciated on auscultation.  Extremities: There is trace pitting edema in the distal lower extremities bilaterally.   Skin: Warm and dry without trophic changes noted.  Musculoskeletal: exam reveals no obvious joint deformities, tenderness or joint swelling or erythema.   Neurologically:  Mental status: The patient is awake, alert and oriented in all 4 spheres. His immediate and remote memory, attention, language skills and fund of knowledge are appropriate. There is no evidence of aphasia, agnosia, apraxia or anomia. Speech is clear with normal prosody and enunciation. Thought process is linear. Mood is normal and affect is normal.  Cranial nerves II - XII are as described above under HEENT exam.  Motor exam: Normal bulk, strength and tone is noted. There is no obvious tremor. Fine motor skills and coordination: grossly intact.  Cerebellar testing: No dysmetria or intention tremor. There is no truncal or gait ataxia.  Sensory exam: intact to light touch in the upper and lower extremities.  Gait, station and balance: He stands easily. No veering to one side is noted. No leaning to one side is noted. Posture is age-appropriate and stance is narrow based. Gait shows normal stride length and normal pace. No problems turning are noted.   Assessment and Plan:  In summary, Gaurav Baldree is a very pleasant 46 y.o.-year old male with an  underlying medical history of lumbar radiculopathy, status post low back surgery, carpal tunnel syndrome, hypertension, and overweight state, whose history and physical exam are concerning for obstructive sleep apnea (OSA). I had a long chat with the patient about my findings and the diagnosis of OSA, its prognosis and treatment options. We talked about medical treatments, surgical interventions and non-pharmacological approaches. I explained in particular the risks and ramifications of untreated moderate to severe OSA, especially with respect to developing cardiovascular disease down the Road, including congestive heart failure, difficult to treat hypertension, cardiac arrhythmias, or stroke. Even type 2 diabetes has, in part, been linked to untreated OSA. Symptoms of untreated OSA include daytime sleepiness, memory problems, mood irritability and mood disorder such as depression and anxiety, lack of energy, as well as recurrent headaches, especially morning headaches. We talked about ongoing complete tobacco cessation trying to maintain a healthy lifestyle in general, as well as the importance of weight control. We also talked about the importance of good sleep hygiene.   I recommended the following at this time: sleep study; I outlined the differences between a laboratory attended sleep study versus home sleep testing.  He would be willing to come in for a nocturnal polysomnogram/in lab sleep study.   I explained the sleep test procedure to the patient and also outlined possible surgical and non-surgical treatment options of OSA, including the use of a custom-made dental device (which would require a referral to a specialist dentist or oral surgeon), upper airway surgical options, such as traditional UPPP or a novel less invasive surgical option in the form of Inspire hypoglossal nerve stimulation (which would involve a referral to an ENT surgeon). I also explained the CPAP treatment option to the patient, who  indicated that he would be willing to try PAP therapy, if the need arises. I explained the importance of being compliant with PAP treatment, not only for insurance purposes but primarily to improve His symptoms, and for the patient's long term health benefit, including to reduce His cardiovascular risks. I answered all his questions today and the patient was in agreement. I plan to see him back after the sleep study is completed and encouraged him to call with any interim questions, concerns, problems or updates.   Thank you very much for allowing me to participate in the care of this  nice patient. If I can be of any further assistance to you please do not hesitate to call me at [phone].  Sincerely,   Star Age, MD, PhD

## 2021-07-10 ENCOUNTER — Telehealth: Payer: Self-pay | Admitting: Neurology

## 2021-07-10 NOTE — Telephone Encounter (Signed)
[employer] pending faxed notes 

## 2021-07-12 NOTE — Telephone Encounter (Signed)
[employer]- [employer] auth: [id number] (exp. 07/10/21 to 10/10/21). Patient is scheduled at [facility] for 07/25/21 at 8 pm. I mailed packet to him and sent a mychart message.

## 2021-07-13 ENCOUNTER — Encounter: Payer: 59 | Admitting: Specialist

## 2021-07-25 ENCOUNTER — Ambulatory Visit (INDEPENDENT_AMBULATORY_CARE_PROVIDER_SITE_OTHER): Payer: 59 | Admitting: Neurology

## 2021-07-25 DIAGNOSIS — G4733 Obstructive sleep apnea (adult) (pediatric): Secondary | ICD-10-CM

## 2021-07-25 DIAGNOSIS — E663 Overweight: Secondary | ICD-10-CM

## 2021-07-25 DIAGNOSIS — G4720 Circadian rhythm sleep disorder, unspecified type: Secondary | ICD-10-CM

## 2021-07-25 DIAGNOSIS — G4719 Other hypersomnia: Secondary | ICD-10-CM

## 2021-07-25 DIAGNOSIS — R0681 Apnea, not elsewhere classified: Secondary | ICD-10-CM

## 2021-07-25 DIAGNOSIS — R351 Nocturia: Secondary | ICD-10-CM

## 2021-07-25 DIAGNOSIS — R0683 Snoring: Secondary | ICD-10-CM

## 2021-07-25 DIAGNOSIS — R519 Headache, unspecified: Secondary | ICD-10-CM

## 2021-07-26 NOTE — Addendum Note (Signed)
Addended by: Huston Foley on: 07/26/2021 05:38 PM   Modules accepted: Orders

## 2021-07-26 NOTE — Procedures (Signed)
PATIENT'S NAME:  Edwin Cline, Edwin Cline DOB:      [birth year]      MR#:    [id number]     DATE OF RECORDING: 07/25/2021 REFERRING M.D.:  Dulce Sellar, NP Study Performed:   Baseline Polysomnogram HISTORY: 46 year old man with a history of lumbar radiculopathy, status post low back surgery, carpal tunnel syndrome, hypertension, and overweight state, who reports snoring and excessive daytime somnolence as well as witnessed apneas. The patient endorsed the Epworth Sleepiness Scale at 19 points. The patient's weight 229 pounds with a height of 75 (inches), resulting in a BMI of 28.5 kg/m2. The patient's neck circumference measured 18.2 inches. CURRENT MEDICATIONS: Tylenol, Epinephrine, Lodine, Hyzaar, Oxy ir/roxicodone   PROCEDURE:  This is a multichannel digital polysomnogram utilizing the Somnostar 11.2 system.  Electrodes and sensors were applied and monitored per AASM Specifications.   EEG, EOG, Chin and Limb EMG, were sampled at 200 Hz.  ECG, Snore and Nasal Pressure, Thermal Airflow, Respiratory Effort, CPAP Flow and Pressure, Oximetry was sampled at 50 Hz. Digital video and audio were recorded.      BASELINE STUDY  Lights Out was at 20:50 and Lights On at 02:58 (per patient request). Total recording time (TRT) was 368.5 minutes, with a total sleep time (TST) of 346.5 minutes.   The patient's sleep latency was 5 minutes.  REM latency was 104.5 minutes.  The sleep efficiency was 94. %.     SLEEP ARCHITECTURE: WASO (Wake after sleep onset) was 15.5 minutes with minimal sleep fragmentation noted.  There were 4 minutes in Stage N1, 268.5 minutes Stage N2, 37.5 minutes Stage N3 and 36.5 minutes in Stage REM.  The percentage of Stage N1 was 1.2%, Stage N2 was 77.5%, which is markedly increased, Stage N3 was 10.8% and Stage R (REM sleep) was 10.5%, which is reduced. The arousals were noted as: 17 were spontaneous, 0 were associated with PLMs, 9 were associated with respiratory events.  RESPIRATORY ANALYSIS:   There were a total of 107 respiratory events:  0 obstructive apneas, 0 central apneas and 1 mixed apneas with a total of 1 apneas and an apnea index (AI) of .2 /hour. There were 106 hypopneas with a hypopnea index of 18.4 /hour. The patient also had 0 respiratory event related arousals (RERAs).      The total APNEA/HYPOPNEA INDEX (AHI) was 18.5/hour and the total RESPIRATORY DISTURBANCE INDEX was  18.5 /hour.  21 events occurred in REM sleep and 171 events in NREM. The REM AHI was  34.5 /hour, versus a non-REM AHI of 16.6. The patient spent 83.5 minutes of total sleep time in the supine position and 263 minutes in non-supine.. The supine AHI was 38.1 versus a non-supine AHI of 12.3.  OXYGEN SATURATION & C02:  The Wake baseline 02 saturation was 96%, with the lowest being 89%. Time spent below 89% saturation equaled 0 minutes.  PERIODIC LIMB MOVEMENTS: The patient had a total of 0 Periodic Limb Movements.  The Periodic Limb Movement (PLM) index was 0 and the PLM Arousal index was 0/hour.  Audio and video analysis did not show any abnormal or unusual movements, behaviors, phonations or vocalizations. The patient took no bathroom breaks. Mild snoring was noted. The EKG was in keeping with normal sinus rhythm (NSR).  Post-study, the patient indicated that sleep was better than usual.   IMPRESSION:  Obstructive Sleep Apnea (OSA) Dysfunctions associated with sleep stages or arousal from sleep  RECOMMENDATIONS:  This study demonstrates moderate obstructive sleep apnea (by number  of events), more pronounced during REM sleep and in the supine sleep position, with a total AHI of 18.5/hour, REM AHI of 34.5/hour, supine AHI of 38.1/hour and O2 nadir of 89%. Treatment with positive airway pressure is recommended. I will recommend home autoPAP therapy for now. A full night titration study can be considered to optimize therapy settings, mask fit, monitoring of tolerance and of proper oxygen saturations, if  needed, down the road. Other treatment options may be limited, and may include (generally speaking) surgical options in selected patients or the use of an oral appliance in certain patients. Some weight loss may aid in reducing his sleep disordered breathing. Please note, that untreated obstructive sleep apnea may carry additional perioperative morbidity. Patients with significant obstructive sleep apnea should receive perioperative PAP therapy and the surgeons and particularly the anesthesiologist should be informed of the diagnosis and the severity of the sleep disordered breathing. This study shows sleep fragmentation and abnormal sleep stage percentages; these are nonspecific findings and per se do not signify an intrinsic sleep disorder or a cause for the patient's sleep-related symptoms. Causes include (but are not limited to) the first night effect of the sleep study, circadian rhythm disturbances, medication effect or an underlying mood disorder or medical problem.  The patient should be cautioned not to drive, work at heights, or operate dangerous or heavy equipment when tired or sleepy. Review and reiteration of good sleep hygiene measures should be pursued with any patient. The patient will be seen in follow-up in the sleep clinic at [facility] for discussion of the test results, symptom and treatment compliance review, further management strategies, etc. The patient and his referring provider will be notified of the test results.  I certify that I have reviewed the entire raw data recording prior to the issuance of this report in accordance with the Standards of Accreditation of the American Academy of Sleep Medicine (AASM)  Huston Foley, MD, PhD Diplomat, American Board of Neurology and Sleep Medicine (Neurology and Sleep Medicine)

## 2021-08-01 ENCOUNTER — Telehealth: Payer: Self-pay

## 2021-08-01 NOTE — Telephone Encounter (Signed)
-----   Message from Huston Foley, MD sent at 07/26/2021  5:37 PM EDT ----- Patient referred by PCP, seen by me on 07/06/21, diagnostic PSG on 07/25/21.    Please call and notify the patient that the recent sleep study showed moderate obstructive sleep apnea. I recommend treatment in the form of autoPAP, which means, that we don't have to bring him back for a second sleep study with CPAP, but will let him try an autoPAP machine at home, through a DME company (of his choice, or as per insurance requirement). The DME representative will educate him on how to use the machine, how to put the mask on, etc. I have placed an order in the chart. Please send referral, talk to patient, send report to referring MD. We will need a FU in sleep clinic for 10 weeks post-PAP set up, please arrange that with me or one of our NPs. Thanks,   Huston Foley, MD, PhD [facility])

## 2021-08-01 NOTE — Telephone Encounter (Signed)
Pt returned phone call would like a call back. 

## 2021-08-01 NOTE — Telephone Encounter (Signed)
I called pt. No answer, left a message asking pt to call me back.   

## 2021-08-01 NOTE — Telephone Encounter (Signed)
I called pt. I advised pt that Dr. Frances Furbish reviewed their sleep study results and found that pt has moderate osa. Dr. Frances Furbish recommends that pt start an auto pap at home. I reviewed PAP compliance expectations with the pt. Pt is agreeable to starting an auto-PAP. I advised pt that an order will be sent to a DME, [facility], and [facility] will call the pt within about one week after they file with the pt's insurance. [facility] will show the pt how to use the machine, fit for masks, and troubleshoot the auto-PAP if needed. A follow up appt was made for insurance purposes with Shanda Bumps, NP on 11/09/21 at 3:45pm. Pt verbalized understanding to arrive 15 minutes early and bring their auto-PAP. A letter with all of this information in it will be mailed to the pt as a reminder. I verified with the pt that the address we have on file is correct. Pt verbalized understanding of results. Pt had no questions at this time but was encouraged to call back if questions arise. I have sent the order to [facility] and have received confirmation that they have received the order.

## 2021-10-09 ENCOUNTER — Encounter: Payer: Self-pay | Admitting: *Deleted

## 2021-11-09 ENCOUNTER — Encounter: Payer: 59 | Admitting: Adult Health

## 2021-11-13 ENCOUNTER — Ambulatory Visit: Payer: 59 | Admitting: Adult Health

## 2021-11-13 ENCOUNTER — Encounter: Payer: Self-pay | Admitting: Adult Health

## 2021-11-13 VITALS — BP 142/90 | HR 85 | Ht 75.0 in | Wt 232.1 lb

## 2021-11-13 DIAGNOSIS — Z789 Other specified health status: Secondary | ICD-10-CM

## 2021-11-13 DIAGNOSIS — G4733 Obstructive sleep apnea (adult) (pediatric): Secondary | ICD-10-CM

## 2021-11-13 NOTE — Progress Notes (Signed)
[facility] [address] [place]. [place] [address] [phone]       OFFICE FOLLOW UP NOTE  Edwin Cline Date of Birth:  Sep 09, 1975 Medical Record Number:  [id number]   Reason for visit: Initial CPAP follow-up    SUBJECTIVE:   CHIEF COMPLAINT:  Chief Complaint  Patient presents with   Obstructive Sleep Apnea    Pt reports doing okay. He states when he caught a bad cold he couldn't use it. He states within the last month he hasn't used it as much. Room 2 alone     HPI:   Update 11/13/2021 Edwin Cline: patient is being seen for initial CPAP f/u visit.  Compliance report as below.  Reports initially having difficulty tolerating CPAP but was gradually getting more used to it.  Unfortunately, over the past month, he has been dealing with a cold which made use of CPAP difficult due to congestion and then after he recovers from his cold, he ended up having a tooth abscess which lasted about 10 days and made it painful to use his CPAP.  He has been gradually trying to get back to routine use of CPAP but has had difficulty tolerating prolonged duration, wondering if he would be more tolerant to nasal pillow mask.  He does report improvement of sleep quality and energy levels with use of CPAP.  He is very eager to continue usage.  Epworth Sleepiness Scale 9/24 (prior to CPAP 19/24)      Consult visit 07/06/2021 Dr. Rexene Cline:  [name] is a 46 year old right-handed gentleman with an underlying medical history of lumbar radiculopathy, status post low back surgery, carpal tunnel syndrome, hypertension, and overweight state, who reports snoring and excessive daytime somnolence as well as witnessed apneas per wife's report.  I reviewed your office note from 05/11/2021.  His Epworth sleepiness score is 19 out of 24, fatigue severity score is 32 out of 63.  He had recent back surgery and was told by Dr. Louanne Cline that he had gasping events or apneas while in recovery.  He has woken up  with a sense of panic and gasping and unable to breathe properly.  He is at times scared to go back to sleep at night.  He is going to start a new job next week.  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  His work will be from 5 AM to up to 3 PM.  He generally goes to bed between 9 and 9:30 PM but will go to bed earlier when he starts his job, between 7:30 PM and 8 PM with a rise time of 3:45 AM.  He lives with his wife and his brother.  He has 2 children, a 37 year old daughter who lives on her own and a 53 year old son who lives with his mom and stays with patient on the weekends typically.  Patient is a non-smoker of cigarettes and stopped smoking cigars about a month ago.  Of note, he also stopped using his blood pressure medicine about a month ago and has not notified you yet.  He is no longer oxycodone or fentanyl.  He drinks caffeine in the form of energy drinks about 2/day.  He drinks alcohol in the form of wine occasionally, not daily.  His weight has been fluctuating, recently more stable.        ROS:   14 system review of systems performed and negative with  exception of those listed in HPI  PMH:  Past Medical History:  Diagnosis Date   Carpal tunnel syndrome     PSH:  Past Surgical History:  Procedure Laterality Date   BACK SURGERY  2012   LUMBAR LAMINECTOMY/DECOMPRESSION MICRODISCECTOMY Left 04/18/2021   Procedure: REDO MICRODISCECTOMY Left L5-S1;  Surgeon: Edwin Champagne, MD;  Location: [facility] OR;  Service: Orthopedics;  Laterality: Left;    Social History:  Social History   Socioeconomic History   Marital status: Married    Spouse name: Not on file   Number of children: 2   Years of education: Not on file   Highest education level: Not on file  Occupational History   Not on file  Tobacco Use   Smoking status: Former    Types: Cigars   Smokeless tobacco: Former   Tobacco comments:    Smokes black  n milds every day  Vaping Use   Vaping Use: Never used  Substance and Sexual Activity   Alcohol use: Not Currently   Drug use: No   Sexual activity: Yes  Other Topics Concern   Not on file  Social History Narrative   Not on file   Social Determinants of Health   Financial Resource Strain: Not on file  Food Insecurity: Not on file  Transportation Needs: Not on file  Physical Activity: Not on file  Stress: Not on file  Social Connections: Not on file  Intimate Partner Violence: Not on file    Family History:  Family History  Problem Relation Age of Onset   Diabetes Father    Sleep apnea Neg Hx     Medications:   Current Outpatient Medications on File Prior to Visit  Medication Sig Dispense Refill   EPINEPHrine 0.3 mg/0.3 mL IJ SOAJ injection Inject 0.3 mg into the muscle as needed for anaphylaxis. 1 each 0   No current facility-administered medications on file prior to visit.    Allergies:   Allergies  Allergen Reactions   Shrimp Flavor [Flavoring Agent] Hives      OBJECTIVE:  Physical Exam  Vitals:   11/13/21 1508  BP: (!) 142/90  Pulse: 85  Weight: 232 lb 2 oz (105.3 kg)  Height: 6\' 3"  (1.905 m)   Body mass index is 29.01 kg/m. No results found.   General: well developed, well nourished, very pleasant middle-age African-American male, seated, in no evident distress Head: head normocephalic and atraumatic.   Neck: supple with no carotid or supraclavicular bruits Cardiovascular: regular rate and rhythm, no murmurs Musculoskeletal: no deformity Skin:  no rash/petichiae Vascular:  Normal pulses all extremities   Neurologic Exam Mental Status: Awake and fully alert. Oriented to place and time. Recent and remote memory intact. Attention span, concentration and fund of knowledge appropriate. Mood and affect appropriate.  Cranial Nerves: Pupils equal, briskly reactive to light. Extraocular movements full without nystagmus. Visual fields full to  confrontation. Hearing intact. Facial sensation intact. Face, tongue, palate moves normally and symmetrically.  Motor: Normal bulk and tone. Normal strength in all tested extremity muscles Sensory.: intact to touch , pinprick , position and vibratory sensation.  Coordination: Rapid alternating movements normal in all extremities. Finger-to-nose and heel-to-shin performed accurately bilaterally. Gait and Station: Arises from chair without difficulty. Stance is normal. Gait demonstrates normal stride length and balance without use of AD. Tandem walk and heel toe without difficulty.  Reflexes: 1+ and symmetric. Toes downgoing.         ASSESSMENT/PLAN: Edwin Cline is a  46 y.o. year old male     OSA on CPAP : Compliance report shows low usage although optimal residual AHI.  Has been having difficulty tolerating fullface CPAP mask as well as recent illness and tooth abscess making tolerance even harder.  He has since recovered from illness and tooth abscess and is eager to get restarted on CPAP but continues to have difficulty tolerating fullface mask -will request mask refitting by DME company.  Discussed importance of nightly usage with ensuring greater than 4 hours per night for optimal benefit and per insurance requirements. Continue to follow with DME company for any needed supplies or CPAP related concerns     Follow up in 5 months or call earlier if needed   CC:  PCP: Dulce Sellar, NP    I spent 24 minutes of face-to-face and non-face-to-face time with patient.  This included previsit chart review, lab review, study review, order entry, electronic health record documentation, patient education regarding diagnosis of sleep apnea with review and discussion of compliance report and answered all other questions to patient's satisfaction   Ihor Austin, [facility]  [facility] [address] [place], [place] [address]  Phone [phone] Fax  [phone] Note: This document was prepared with digital dictation and possible smart phrase technology. Any transcriptional errors that result from this process are unintentional.

## 2021-11-14 NOTE — Progress Notes (Signed)
CPAP Orders have been faxed to DME on file [facility]. 11/14/21

## 2021-12-28 ENCOUNTER — Encounter: Payer: Self-pay | Admitting: *Deleted

## 2022-01-09 ENCOUNTER — Other Ambulatory Visit (HOSPITAL_BASED_OUTPATIENT_CLINIC_OR_DEPARTMENT_OTHER): Payer: Self-pay

## 2022-04-09 NOTE — Progress Notes (Deleted)
[facility] [address] [place]. [place] [address] [phone]       OFFICE FOLLOW UP NOTE  [name] Date of Birth:  1975/06/12 Medical Record Number:  [id number]   Reason for visit: Initial CPAP follow-up    SUBJECTIVE:   CHIEF COMPLAINT:  No chief complaint on file.  Follow-up visit:  Prior visit: 11/13/2021  Brief HPI:   Edwin Cline is a 47 y.o. male who is being followed for OSA on CPAP.  Initially seen by Dr. Rexene Alberts on 07/06/2021 for concern of underlying sleep apnea.  Completed sleep study 07/2021 which showed moderate OSA more pronounced during REM sleep and supine sleep position, with a total AHI of 18.5/hour, REM AHI of 34.5/hour, supine AHI of 38.1/hour and O2 nadir of 89%.  AutoPap recommended.    Update 11/13/2021 JM: patient is being seen for initial CPAP f/u visit.  Compliance report as below.  Reports initially having difficulty tolerating CPAP but was gradually getting more used to it.  Unfortunately, over the past month, he has been dealing with a cold which made use of CPAP difficult due to congestion and then after he recovers from his cold, he ended up having a tooth abscess which lasted about 10 days and made it painful to use his CPAP.  He has been gradually trying to get back to routine use of CPAP but has had difficulty tolerating prolonged duration, wondering if he would be more tolerant to nasal pillow mask.  He does report improvement of sleep quality and energy levels with use of CPAP.  He is very eager to continue usage.  Epworth Sleepiness Scale 9/24 (prior to CPAP 19/24)      Consult visit 07/06/2021 Dr. Rexene Alberts:  Edwin Cline is a 47 year old right-handed gentleman with an underlying medical history of lumbar radiculopathy, status post low back surgery, carpal tunnel syndrome, hypertension, and overweight state, who reports snoring and excessive daytime somnolence as well as witnessed apneas per wife's report.  I  reviewed your office note from 05/11/2021.  His Epworth sleepiness score is 19 out of 24, fatigue severity score is 32 out of 63.  He had recent back surgery and was told by Dr. Louanne Skye that he had gasping events or apneas while in recovery.  He has woken up with a sense of panic and gasping and unable to breathe properly.  He is at times scared to go back to sleep at night.  He is going to start a new job next week.  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  His work will be from 5 AM to up to 3 PM.  He generally goes to bed between 9 and 9:30 PM but will go to bed earlier when he starts his job, between 7:30 PM and 8 PM with a rise time of 3:45 AM.  He lives with his wife and his brother.  He has 2 children, a 34 year old daughter who lives on her own and a 36 year old son who lives with his mom and stays with patient on the weekends typically.  Patient is a non-smoker of cigarettes and stopped smoking cigars about a month ago.  Of note, he also stopped using his blood pressure medicine about a month ago and has not notified you yet.  He is no longer oxycodone or fentanyl.  He drinks caffeine in the form of energy drinks about 2/day.  He drinks  alcohol in the form of wine occasionally, not daily.  His weight has been fluctuating, recently more stable.        ROS:   14 system review of systems performed and negative with exception of those listed in HPI  PMH:  Past Medical History:  Diagnosis Date   Carpal tunnel syndrome     PSH:  Past Surgical History:  Procedure Laterality Date   BACK SURGERY  2012   LUMBAR LAMINECTOMY/DECOMPRESSION MICRODISCECTOMY Left 04/18/2021   Procedure: REDO MICRODISCECTOMY Left L5-S1;  Surgeon: Jessy Oto, MD;  Location: [facility];  Service: Orthopedics;  Laterality: Left;    Social History:  Social History   Socioeconomic History   Marital status: Married    Spouse name: Not on file    Number of children: 2   Years of education: Not on file   Highest education level: Not on file  Occupational History   Not on file  Tobacco Use   Smoking status: Former    Types: Cigars   Smokeless tobacco: Former   Tobacco comments:    Smokes black n milds every day  Vaping Use   Vaping Use: Never used  Substance and Sexual Activity   Alcohol use: Not Currently   Drug use: No   Sexual activity: Yes  Other Topics Concern   Not on file  Social History Narrative   Not on file   Social Determinants of Health   Financial Resource Strain: Not on file  Food Insecurity: Not on file  Transportation Needs: Not on file  Physical Activity: Not on file  Stress: Not on file  Social Connections: Not on file  Intimate Partner Violence: Not on file    Family History:  Family History  Problem Relation Age of Onset   Diabetes Father    Sleep apnea Neg Hx     Medications:   Current Outpatient Medications on File Prior to Visit  Medication Sig Dispense Refill   EPINEPHrine 0.3 mg/0.3 mL IJ SOAJ injection Inject 0.3 mg into the muscle as needed for anaphylaxis. 1 each 0   No current facility-administered medications on file prior to visit.    Allergies:   Allergies  Allergen Reactions   Shrimp Flavor [Flavoring Agent] Hives      OBJECTIVE:  Physical Exam  There were no vitals filed for this visit.  There is no height or weight on file to calculate BMI. No results found.   General: well developed, well nourished, very pleasant middle-age African-American male, seated, in no evident distress Head: head normocephalic and atraumatic.   Neck: supple with no carotid or supraclavicular bruits Cardiovascular: regular rate and rhythm, no murmurs Musculoskeletal: no deformity Skin:  no rash/petichiae Vascular:  Normal pulses all extremities   Neurologic Exam Mental Status: Awake and fully alert. Oriented to place and time. Recent and remote memory intact. Attention span,  concentration and fund of knowledge appropriate. Mood and affect appropriate.  Cranial Nerves: Pupils equal, briskly reactive to light. Extraocular movements full without nystagmus. Visual fields full to confrontation. Hearing intact. Facial sensation intact. Face, tongue, palate moves normally and symmetrically.  Motor: Normal bulk and tone. Normal strength in all tested extremity muscles Sensory.: intact to touch , pinprick , position and vibratory sensation.  Coordination: Rapid alternating movements normal in all extremities. Finger-to-nose and heel-to-shin performed accurately bilaterally. Gait and Station: Arises from chair without difficulty. Stance is normal. Gait demonstrates normal stride length and balance without use of AD. Tandem walk and heel  toe without difficulty.  Reflexes: 1+ and symmetric. Toes downgoing.         ASSESSMENT/PLAN: Edwin Cline is a 47 y.o. year old male     OSA on CPAP : Compliance report shows low usage although optimal residual AHI.  Has been having difficulty tolerating fullface CPAP mask as well as recent illness and tooth abscess making tolerance even harder.  He has since recovered from illness and tooth abscess and is eager to get restarted on CPAP but continues to have difficulty tolerating fullface mask -will request mask refitting by DME company.  Discussed importance of nightly usage with ensuring greater than 4 hours per night for optimal benefit and per insurance requirements. Continue to follow with DME company for any needed supplies or CPAP related concerns     Follow up in 5 months or call earlier if needed   CC:  PCP: Jeanie Sewer, NP    I spent 24 minutes of face-to-face and non-face-to-face time with patient.  This included previsit chart review, lab review, study review, order entry, electronic health record documentation, patient education regarding diagnosis of sleep apnea with review and discussion of compliance report  and answered all other questions to patient's satisfaction   Frann Rider, [facility]  [facility] [address] [place], [place] [address]  Phone [phone] Fax [phone] Note: This document was prepared with digital dictation and possible smart phrase technology. Any transcriptional errors that result from this process are unintentional.

## 2022-04-10 ENCOUNTER — Ambulatory Visit: Payer: Self-pay | Admitting: Adult Health

## 2022-04-30 ENCOUNTER — Encounter: Payer: Self-pay | Admitting: *Deleted

## 2022-09-19 ENCOUNTER — Encounter: Payer: Self-pay | Admitting: Family

## 2022-09-19 ENCOUNTER — Ambulatory Visit: Payer: 59 | Admitting: Family

## 2022-09-19 VITALS — BP 164/111 | HR 82 | Temp 98.0°F | Ht 75.0 in | Wt 228.6 lb

## 2022-09-19 DIAGNOSIS — Z1211 Encounter for screening for malignant neoplasm of colon: Secondary | ICD-10-CM

## 2022-09-19 DIAGNOSIS — G4733 Obstructive sleep apnea (adult) (pediatric): Secondary | ICD-10-CM

## 2022-09-19 DIAGNOSIS — I10 Essential (primary) hypertension: Secondary | ICD-10-CM

## 2022-09-19 MED ORDER — HYDROCHLOROTHIAZIDE 12.5 MG PO TABS: 12.5000 mg | ORAL_TABLET | Freq: Every day | ORAL | 1 refills | Status: DC

## 2022-09-19 MED ORDER — AMLODIPINE BESYLATE 10 MG PO TABS: 10.0000 mg | ORAL_TABLET | Freq: Every day | ORAL | 1 refills | Status: DC

## 2022-09-19 NOTE — Progress Notes (Signed)
Patient ID: Edwin Cline, male    DOB: Jan 04, 1976, 47 y.o.   MRN: [id number]  Chief Complaint  Patient presents with   Hypertension    Pt c/o headaches caused by BP being high at home, Around 166/120.     HPI: Hypertension: Patient is currently maintained on the following medications for blood pressure: Losartan & HCTZ in the past. Failed meds include: none. Patient reports good compliance with blood pressure medications. Patient denies chest pain, headaches, shortness of breath or swelling. 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  sending Amlodipine & HCTZ, advised on use & SE continue to advise on exercise, drinking 2L water daily and low sodium diet, handout provided reinforcing education.  f/u in 2w  for fasting labs & visit.  Orders: -     amLODIPine Besylate; Take 1 tablet (10 mg total) by mouth daily.  Dispense: 90 tablet; Refill: 1 -     hydroCHLOROthiazide; Take 1 tablet (12.5 mg total) by mouth daily.  Dispense: 90 tablet; Refill: 1  Screening for colon cancer -     Ambulatory referral to Gastroenterology  OSA (obstructive sleep apnea) pt reports he is unable to use the mask or nasal pillows with his CPAP, he has been wearing a mouth guard which helps him sleep & not snore, but reports feeling very tired during the day. Sending new referral to return to the [place] [facility] where he went last summer to discuss.  -     Ambulatory referral to Sleep Studies   Subjective:    Outpatient Medications Prior to Visit  Medication Sig Dispense Refill   albuterol (VENTOLIN HFA) 108 (90 Base) MCG/ACT inhaler Inhale into the lungs.     EPINEPHrine  0.3 mg/0.3 mL IJ SOAJ injection Inject 0.3 mg into the muscle as needed for anaphylaxis. 1 each 0   No facility-administered medications prior to visit.   Past Medical History:  Diagnosis Date   Carpal tunnel syndrome    Past Surgical History:  Procedure Laterality Date   BACK SURGERY  2012   LUMBAR LAMINECTOMY/DECOMPRESSION MICRODISCECTOMY Left 04/18/2021   Procedure: REDO MICRODISCECTOMY Left L5-S1;  Surgeon: Kerrin Champagne, MD;  Location: [facility] OR;  Service: Orthopedics;  Laterality: Left;   Allergies  Allergen Reactions   Shrimp Flavor [Flavoring Agent] Hives      Objective:    Physical Exam Vitals and nursing note reviewed.  Constitutional:      General: He is not in acute distress.    Appearance: Normal appearance.  HENT:     Head: Normocephalic.  Cardiovascular:     Rate and Rhythm: Normal rate and regular rhythm.  Pulmonary:     Effort: Pulmonary effort is normal.     Breath sounds: Normal breath sounds.  Musculoskeletal:        General: Normal range of motion.     Cervical back: Normal range of motion.  Skin:    General: Skin is warm and dry.  Neurological:     Mental Status: He is alert and oriented to person, place, and time.  Psychiatric:        Mood and Affect: Mood  normal.    BP (!) 164/111   Pulse 82   Temp 98 F (36.7 C) (Temporal)   Ht 6\' 3"  (1.905 m)   Wt 228 lb 9.6 oz (103.7 kg)   SpO2 98%   BMI 28.57 kg/m  Wt Readings from Last 3 Encounters:  09/19/22 228 lb 9.6 oz (103.7 kg)  11/13/21 232 lb 2 oz (105.3 kg)  07/06/21 229 lb 6.4 oz (104.1 kg)      Dulce Sellar, NP

## 2022-09-19 NOTE — Assessment & Plan Note (Addendum)
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  sending Amlodipine & HCTZ, advised on use & SE continue to advise on exercise, drinking 2L water daily and low sodium diet, handout provided reinforcing education.  f/u in 2w  for fasting labs & visit.

## 2022-09-19 NOTE — Assessment & Plan Note (Signed)
chronic seen by [place] [facility] at [facility] last year & dx pt reports he is unable to use the mask or nasal pillows with his CPAP, he has been wearing a mouth guard which helps him sleep & not snore, but reports feeling very tired during the day advised on returning to center for advise

## 2022-09-19 NOTE — Patient Instructions (Signed)
It was very nice to see you today!   See he handout attached regarding HIGH BLOOD PRESSURE. You need to follow up in one month so I can see if the medicine is working.  We can do labs at that time as well.      PLEASE NOTE:  If you had any lab tests please let us know if you have not heard back within a few days. You may see your results on MyChart before we have a chance to review them but we will give you a call once they are reviewed by [place]. If we ordered any referrals today, please let us know if you have not heard from their office within the next week.

## 2022-09-27 ENCOUNTER — Telehealth: Payer: Self-pay | Admitting: Physical Medicine and Rehabilitation

## 2022-09-27 NOTE — Telephone Encounter (Signed)
Patient's GF called. Patient would like an appointment with Dr. Alvester Morin. Her cb# is [phone]

## 2022-09-28 ENCOUNTER — Encounter: Payer: Self-pay | Admitting: Pharmacist

## 2022-10-03 NOTE — Telephone Encounter (Signed)
Ok to schedule with Dr. Alvester Morin or do I need to schedule with you?

## 2022-10-04 ENCOUNTER — Ambulatory Visit: Payer: 59 | Admitting: Family

## 2022-10-04 ENCOUNTER — Encounter: Payer: Self-pay | Admitting: Family

## 2022-10-04 VITALS — BP 132/87 | HR 86 | Temp 98.0°F | Ht 75.0 in | Wt 230.6 lb

## 2022-10-04 DIAGNOSIS — I10 Essential (primary) hypertension: Secondary | ICD-10-CM

## 2022-10-04 MED ORDER — HYDROCHLOROTHIAZIDE 12.5 MG PO TABS
12.5000 mg | ORAL_TABLET | Freq: Every day | ORAL | 1 refills | Status: DC
Start: 2022-10-04 — End: 2023-02-27

## 2022-10-04 MED ORDER — AMLODIPINE BESYLATE 10 MG PO TABS: 10.0000 mg | ORAL_TABLET | Freq: Every day | ORAL | 1 refills | Status: DC

## 2022-10-04 NOTE — Progress Notes (Signed)
Patient ID: Edwin Cline, male    DOB: Sep 21, 1975, 47 y.o.   MRN: [id number]  Chief Complaint  Patient presents with   Hypertension    Follow up , Pt has taken BP medication this morning at 5:30 am    HPI: Hypertension: Patient is currently maintained on the following medications for blood pressure: Amodipine 10mg  and HCTZ 12.5mg  daily Failed meds include: none. Patient reports good compliance with blood pressure medications. Patient denies chest pain, headaches, shortness of breath or swelling. Last 3 blood pressure readings in our office are as follows: BP Readings from Last 3 Encounters:  10/04/22 132/87  09/19/22 (!) 164/111  11/13/21 (!) 142/90    Assessment & Plan:  Essential hypertension Assessment & Plan: Chronic, stable taking Amlodipine 10mg  & HCTZ 12.5mg , advised on use & SE continue to advise on exercise, drinking 2L water daily and low sodium diet, handout provided reinforcing education.  f/u in 73m w/labs, fasting if able  Orders: -     amLODIPine Besylate; Take 1 tablet (10 mg total) by mouth daily.  Dispense: 90 tablet; Refill: 1 -     hydroCHLOROthiazide; Take 1 tablet (12.5 mg total) by mouth daily.  Dispense: 90 tablet; Refill: 1   Subjective:    Outpatient Medications Prior to Visit  Medication Sig Dispense Refill   albuterol (VENTOLIN HFA) 108 (90 Base) MCG/ACT inhaler Inhale into the lungs.     EPINEPHrine 0.3 mg/0.3 mL IJ SOAJ injection Inject 0.3 mg into the muscle as needed for anaphylaxis. 1 each 0   amLODipine (NORVASC) 10 MG tablet Take 1 tablet (10 mg total) by mouth daily. 90 tablet 1   hydrochlorothiazide (HYDRODIURIL) 12.5 MG tablet Take 1 tablet (12.5 mg total) by mouth daily. 90 tablet 1   No facility-administered medications prior to visit.   Past Medical History:  Diagnosis Date   Carpal tunnel syndrome    Past Surgical History:  Procedure Laterality Date   BACK SURGERY  2012   LUMBAR LAMINECTOMY/DECOMPRESSION MICRODISCECTOMY  Left 04/18/2021   Procedure: REDO MICRODISCECTOMY Left L5-S1;  Surgeon: Kerrin Champagne, MD;  Location: [facility] OR;  Service: Orthopedics;  Laterality: Left;   Allergies  Allergen Reactions   Shrimp Flavor [Flavoring Agent] Hives      Objective:    Physical Exam Vitals and nursing note reviewed.  Constitutional:      General: He is not in acute distress.    Appearance: Normal appearance.  HENT:     Head: Normocephalic.  Cardiovascular:     Rate and Rhythm: Normal rate and regular rhythm.  Pulmonary:     Effort: Pulmonary effort is normal.     Breath sounds: Normal breath sounds.  Musculoskeletal:        General: Normal range of motion.     Cervical back: Normal range of motion.  Skin:    General: Skin is warm and dry.  Neurological:     Mental Status: He is alert and oriented to person, place, and time.  Psychiatric:        Mood and Affect: Mood normal.    BP 132/87 (BP Location: Left Arm, Patient Position: Sitting, Cuff Size: Normal)   Pulse 86   Temp 98 F (36.7 C) (Temporal)   Ht 6\' 3"  (1.905 m)   Wt 230 lb 9.6 oz (104.6 kg)   SpO2 99%   BMI 28.82 kg/m  Wt Readings from Last 3 Encounters:  10/04/22 230 lb 9.6 oz (104.6 kg)  09/19/22 228 lb 9.6  oz (103.7 kg)  11/13/21 232 lb 2 oz (105.3 kg)      Dulce Sellar, NP

## 2022-10-04 NOTE — Assessment & Plan Note (Signed)
Chronic, stable taking Amlodipine 10mg  & HCTZ 12.5mg , advised on use & SE continue to advise on exercise, drinking 2L water daily and low sodium diet, handout provided reinforcing education.  f/u in 67m w/labs, fasting if able

## 2022-11-05 ENCOUNTER — Other Ambulatory Visit (INDEPENDENT_AMBULATORY_CARE_PROVIDER_SITE_OTHER): Payer: 59

## 2022-11-05 ENCOUNTER — Ambulatory Visit (INDEPENDENT_AMBULATORY_CARE_PROVIDER_SITE_OTHER): Payer: 59 | Admitting: Orthopedic Surgery

## 2022-11-05 ENCOUNTER — Encounter: Payer: Self-pay | Admitting: Orthopedic Surgery

## 2022-11-05 VITALS — BP 120/83 | HR 99 | Ht 75.0 in | Wt 231.0 lb

## 2022-11-05 DIAGNOSIS — Z9889 Other specified postprocedural states: Secondary | ICD-10-CM

## 2022-11-05 DIAGNOSIS — M5416 Radiculopathy, lumbar region: Secondary | ICD-10-CM

## 2022-11-05 MED ORDER — PREGABALIN 75 MG PO CAPS: 75.0000 mg | ORAL_CAPSULE | Freq: Two times a day (BID) | ORAL | 0 refills | Status: DC

## 2022-11-05 NOTE — Progress Notes (Signed)
Orthopedic Spine Surgery Office Note  Assessment: Patient is a 47 y.o. male with history of left L5/S1 hemilaminotomy with Dr. Otelia Sergeant who presents with left lower extremity paresthesia numbness.  He feels it in the posterior aspect of the thigh and leg into the plantar aspect the foot. Possible radiculopathy   Plan: -Explained that initially conservative treatment is tried as a significant number of patients may experience relief with these treatment modalities. Discussed that the conservative treatments include:  -activity modification  -physical therapy  -over the counter pain medications  -medrol dosepak  -lumbar steroid injections -I told him that there are several possible explanations for his paresthesias and numbness down the posterior aspect of his left leg.  The operative note details a durotomy at the axilla of the nerve root so it could have been injured during surgery.  There also may be residual disc left.  His symptoms also could be due to foraminal stenosis.  Accordingly, recommended further workup at this time -Prescribed Lyrica for additional pain relief -Recommended EMG/NCS to evaluate for radiculopathy -If he is not doing any better at her next visit, will order MRI of the lumbar spine -Patient should return to office in 6 weeks, x-rays at next visit: None   Patient expressed understanding of the plan and all questions were answered to the patient's satisfaction.   ___________________________________________________________________________   History:  Patient is a 47 y.o. male who presents today for lumbar spine.  Patient has a history of 2 microdiscectomies at L5/S1.  He states prior to his most recent microdiscectomy in 2023, he had pain that radiated down the posterior aspect of his left leg.  He said that the left leg pain got better after surgery but he woke up with significant numbness and paresthesias down the posterior aspect of the left thigh and leg into the  plantar aspect of the foot.  He says that this has been symptomatic and significant.  He notes that it has gotten progressively worse over time.  He is not having any pain, numbness, paresthesias radiating into the right lower extremity.  He also feels that his back pain got worse after surgery.  He knows that particularly if he sitting for a while.  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   Weakness: Yes, he feels that his left leg is generally weaker.  No other weakness noted Symptoms of imbalance: Denies Paresthesias and numbness: Yes, has paresthesias and numbness down the posterior aspect of the left thigh and leg into the plantar aspect of the foot.  No other numbness or paresthesias Bowel or bladder incontinence: Denies Saddle anesthesia: Denies  Treatments tried: No recent treatments tried  Review of systems: Denies fevers and chills, night sweats, unexplained weight loss, history of cancer.  Has had pain that wakes him at night  Past medical history: HTN OSA  Allergies: NKDA  Past surgical history:  L5/S1 hemilaminotomy and microdiscectomy (x2)  Social history: Denies use of nicotine product (smoking, vaping, patches, smokeless) Alcohol use: Denies Denies recreational drug use   Physical Exam:  BMI of 28.9  General: no acute distress, appears stated age Neurologic: alert, answering questions appropriately, following commands Respiratory: unlabored breathing on room air, symmetric chest rise Psychiatric: appropriate affect, normal cadence to speech   MSK (spine):  -Strength exam      Left  Right EHL    4/5  5/5 TA    5/5  5/5 GSC    5/5  5/5 Knee extension  5/5  5/5 Hip flexion   5/5  5/5  -Sensory exam    Sensation intact to light touch in L3-S1 nerve distributions of bilateral lower extremities  -Achilles DTR: 2/4 on the left, 2/4 on the right -Patellar tendon DTR: 2/4 on the left, 2/4 on the right  -Straight leg raise:  positive on the left, negative on the right -Femoral nerve stretch test: negative bilaterally -Clonus: no beats bilaterally  -Left hip exam: No pain through range of motion, negative Stinchfield, negative FABER -Right hip exam: No pain through range of motion  Imaging: XRs of the lumbar spine from 11/05/2022 were independently reviewed and interpreted, showing disc height loss at L5/S1. Small anterior osteophyte formation noted at that level. No other significant degenerative changes. No fracture or dislocation seen. No evidence of instability on flexion/extension views.    Patient name: Edwin Cline Patient MRN: [id number] Date of visit: 11/05/22

## 2022-11-08 ENCOUNTER — Ambulatory Visit: Payer: 59 | Admitting: Family

## 2022-11-08 NOTE — Progress Notes (Deleted)
Patient ID: Edwin Cline, male    DOB: 1975-04-14, 47 y.o.   MRN: [id number]  No chief complaint on file.   HPI:           Assessment & Plan:     Subjective:    Outpatient Medications Prior to Visit  Medication Sig Dispense Refill   albuterol (VENTOLIN HFA) 108 (90 Base) MCG/ACT inhaler Inhale into the lungs.     amLODipine (NORVASC) 10 MG tablet Take 1 tablet (10 mg total) by mouth daily. 90 tablet 1   EPINEPHrine 0.3 mg/0.3 mL IJ SOAJ injection Inject 0.3 mg into the muscle as needed for anaphylaxis. 1 each 0   hydrochlorothiazide (HYDRODIURIL) 12.5 MG tablet Take 1 tablet (12.5 mg total) by mouth daily. 90 tablet 1   pregabalin (LYRICA) 75 MG capsule Take 1 capsule (75 mg total) by mouth 2 (two) times daily. 84 capsule 0   No facility-administered medications prior to visit.   Past Medical History:  Diagnosis Date   Carpal tunnel syndrome    Past Surgical History:  Procedure Laterality Date   BACK SURGERY  2012   LUMBAR LAMINECTOMY/DECOMPRESSION MICRODISCECTOMY Left 04/18/2021   Procedure: REDO MICRODISCECTOMY Left L5-S1;  Surgeon: Kerrin Champagne, MD;  Location: [facility] OR;  Service: Orthopedics;  Laterality: Left;   Allergies  Allergen Reactions   Shrimp Flavor [Flavoring Agent] Hives      Objective:    Physical Exam Vitals and nursing note reviewed.  Constitutional:      General: He is not in acute distress.    Appearance: Normal appearance.  HENT:     Head: Normocephalic.     Right Ear: Tympanic membrane and ear canal normal.     Left Ear: Tympanic membrane and ear canal normal.     Nose:     Right Sinus: Frontal sinus tenderness present. No maxillary sinus tenderness.     Left Sinus: Frontal sinus tenderness present. No maxillary sinus tenderness.     Mouth/Throat:     Mouth: Mucous membranes are moist.     Pharynx: No pharyngeal swelling, oropharyngeal exudate, posterior oropharyngeal erythema or uvula swelling.     Tonsils: No tonsillar exudate or  tonsillar abscesses.  Cardiovascular:     Rate and Rhythm: Normal rate and regular rhythm.  Pulmonary:     Effort: Pulmonary effort is normal.     Breath sounds: Normal breath sounds.  Musculoskeletal:        General: Normal range of motion.     Cervical back: Normal range of motion.  Lymphadenopathy:     Head:     Right side of head: No preauricular or posterior auricular adenopathy.     Left side of head: No preauricular or posterior auricular adenopathy.     Cervical: No cervical adenopathy.  Skin:    General: Skin is warm and dry.  Neurological:     Mental Status: He is alert and oriented to person, place, and time.  Psychiatric:        Mood and Affect: Mood normal.    There were no vitals taken for this visit. Wt Readings from Last 3 Encounters:  11/05/22 231 lb (104.8 kg)  10/04/22 230 lb 9.6 oz (104.6 kg)  09/19/22 228 lb 9.6 oz (103.7 kg)       Dulce Sellar, NP

## 2022-11-15 ENCOUNTER — Ambulatory Visit: Payer: 59 | Admitting: Family

## 2022-11-15 ENCOUNTER — Encounter: Payer: Self-pay | Admitting: Family

## 2022-11-15 VITALS — BP 134/85 | HR 99 | Temp 98.0°F | Ht 75.0 in | Wt 232.4 lb

## 2022-11-15 DIAGNOSIS — I10 Essential (primary) hypertension: Secondary | ICD-10-CM

## 2022-11-15 DIAGNOSIS — R600 Localized edema: Secondary | ICD-10-CM

## 2022-11-15 DIAGNOSIS — Z833 Family history of diabetes mellitus: Secondary | ICD-10-CM | POA: Diagnosis not present

## 2022-11-15 NOTE — Progress Notes (Signed)
Patient ID: Edwin Cline, male    DOB: 04-Jan-1976, 47 y.o.   MRN: [id number]  Chief Complaint  Patient presents with   Leg Swelling    Pt c/o leg swelling in bilateral legs and feet, Fatigue, chills and sweating Present off and on for about a month but has been worsening. Pt is taking hydrochlorothiazide.    Discussed the use of AI scribe software for clinical note transcription with the patient, who gave verbal consent to proceed.  History of Present Illness   The patient, with a history of hypertension, presents with intermittent bilateral lower extremity swelling. The swelling, which extends from the feet to the knees, has been occurring for an unspecified duration, even prior to the initiation of amlodipine and a diuretic, but more often since starting the meds. The swelling is more pronounced at the end of the day and is not present upon waking. The patient denies any associated symptoms other than fatigue. He reports increased urination since starting the diuretic and admits to not drinking enough water. He also reports ongoing fatigue and episodes of chills and sweating, which he attributes to sun exposure and possibly inadequate hydration. The patient also experiences leg cramps, particularly at night.     Assessment & Plan:     Lower Extremity Edema w/Hypertension - Patient reports intermittent bilateral lower extremity swelling, from the foot to the knee, which has been worsening since starting Amlodipine. No edema noted during visit today. The swelling is worse at the end of the day and improves with elevation overnight. No current swelling noted on examination. -Split Amlodipine dose to half in the morning and half at night to potentially reduce swelling. -Continue diuretic medication as prescribed every morning. -Encourage patient to maintain hydration, move regularly throughout the day, and consider use of knee-high mild compression socks during the day. -Continue monitoring  blood pressure at home and report any significant changes. -Follow up in 2 months  Fatigue - Patient reports ongoing fatigue and sweating. No clear cause identified during the conversation. -Order metabolic panel, electrolyte panel, and vitamin D level to assess for potential causes of fatigue. -Check blood glucose and A1c due to family history of diabetes and potential symptoms of hyperglycemia.  Leg Cramps - Patient reports experiencing leg cramps, particularly at night. -Check CMP today to assess for potential electrolyte imbalances, particularly potassium. -Encourage patient to maintain hydration to prevent cramps.     Subjective:    Outpatient Medications Prior to Visit  Medication Sig Dispense Refill   albuterol (VENTOLIN HFA) 108 (90 Base) MCG/ACT inhaler Inhale into the lungs.     amLODipine (NORVASC) 10 MG tablet Take 1 tablet (10 mg total) by mouth daily. 90 tablet 1   EPINEPHrine 0.3 mg/0.3 mL IJ SOAJ injection Inject 0.3 mg into the muscle as needed for anaphylaxis. 1 each 0   hydrochlorothiazide (HYDRODIURIL) 12.5 MG tablet Take 1 tablet (12.5 mg total) by mouth daily. 90 tablet 1   pregabalin (LYRICA) 75 MG capsule Take 1 capsule (75 mg total) by mouth 2 (two) times daily. (Patient not taking: Reported on 11/15/2022) 84 capsule 0   No facility-administered medications prior to visit.   Past Medical History:  Diagnosis Date   Carpal tunnel syndrome    Past Surgical History:  Procedure Laterality Date   BACK SURGERY  2012   LUMBAR LAMINECTOMY/DECOMPRESSION MICRODISCECTOMY Left 04/18/2021   Procedure: REDO MICRODISCECTOMY Left L5-S1;  Surgeon: Kerrin Champagne, MD;  Location: [facility] OR;  Service: Orthopedics;  Laterality:  Left;   Allergies  Allergen Reactions   Shrimp Flavor [Flavoring Agent] Hives      Objective:    Physical Exam Vitals and nursing note reviewed.  Constitutional:      General: He is not in acute distress.    Appearance: Normal appearance. He is  obese.  HENT:     Head: Normocephalic.  Cardiovascular:     Rate and Rhythm: Normal rate and regular rhythm.  Pulmonary:     Effort: Pulmonary effort is normal.     Breath sounds: Normal breath sounds.  Musculoskeletal:        General: Normal range of motion.     Cervical back: Normal range of motion.     Right lower leg: No edema.     Left lower leg: No edema.  Skin:    General: Skin is warm and dry.  Neurological:     Mental Status: He is alert and oriented to person, place, and time.  Psychiatric:        Mood and Affect: Mood normal.    BP 134/85 (BP Location: Left Arm, Patient Position: Sitting, Cuff Size: Large)   Pulse 99   Temp 98 F (36.7 C) (Temporal)   Ht 6\' 3"  (1.905 m)   Wt 232 lb 6.4 oz (105.4 kg)   SpO2 96%   BMI 29.05 kg/m  Wt Readings from Last 3 Encounters:  11/15/22 232 lb 6.4 oz (105.4 kg)  11/05/22 231 lb (104.8 kg)  10/04/22 230 lb 9.6 oz (104.6 kg)      Dulce Sellar, NP

## 2022-11-15 NOTE — Assessment & Plan Note (Addendum)
Chronic, stable, BP better today taking Amlodipine 10mg  & HCTZ 12.5mg  reports increased leg/ankles/feet edema in both legs off & on and leg cramps at night advised to cut Amlodipine in half, 1/2 am 1/2 pm continue HCTZ qam checking labs today continue to advise on exercise, drinking 2L water daily and low sodium diet. f/u in 60m

## 2022-11-15 NOTE — Patient Instructions (Signed)
It was very nice to see you today!   I will review your lab results via MyChart in a few days.   Have a great rest of the week!   PLEASE NOTE:  If you had any lab tests please let us know if you have not heard back within a few days. You may see your results on MyChart before we have a chance to review them but we will give you a call once they are reviewed by us. If we ordered any referrals today, please let us know if you have not heard from their office within the next week.    

## 2022-11-16 LAB — HEMOGLOBIN A1C
Hgb A1c MFr Bld: 6.5 %{Hb} — ABNORMAL HIGH (ref <5.7)
Mean Plasma Glucose: 140 mg/dL
eAG (mmol/L): 7.7 mmol/L

## 2022-11-16 LAB — COMPREHENSIVE METABOLIC PANEL
AG Ratio: 1.5 (calc) (ref 1.0–2.5)
ALT: 29 U/L (ref 9–46)
AST: 16 U/L (ref 10–40)
Albumin: 4.5 g/dL (ref 3.6–5.1)
Alkaline phosphatase (APISO): 73 U/L (ref 36–130)
BUN/Creatinine Ratio: 11 (calc) (ref 6–22)
BUN: 16 mg/dL (ref 7–25)
CO2: 27 mmol/L (ref 20–32)
Calcium: 9.4 mg/dL (ref 8.6–10.3)
Chloride: 101 mmol/L (ref 98–110)
Creat: 1.4 mg/dL — ABNORMAL HIGH (ref 0.60–1.29)
Globulin: 3 g/dL (ref 1.9–3.7)
Glucose, Bld: 89 mg/dL (ref 65–99)
Potassium: 4.1 mmol/L (ref 3.5–5.3)
Sodium: 138 mmol/L (ref 135–146)
Total Bilirubin: 0.4 mg/dL (ref 0.2–1.2)
Total Protein: 7.5 g/dL (ref 6.1–8.1)

## 2022-11-16 LAB — LIPID PANEL
Cholesterol: 202 mg/dL — ABNORMAL HIGH (ref <200)
HDL: 34 mg/dL — ABNORMAL LOW (ref >=40)
LDL Cholesterol (Calc): 125 mg/dL — ABNORMAL HIGH
Non-HDL Cholesterol (Calc): 168 mg/dL — ABNORMAL HIGH (ref <130)
Total CHOL/HDL Ratio: 5.9 (calc) — ABNORMAL HIGH (ref <5.0)
Triglycerides: 299 mg/dL — ABNORMAL HIGH (ref <150)

## 2022-11-16 LAB — VITAMIN D 25 HYDROXY (VIT D DEFICIENCY, FRACTURES): Vit D, 25-Hydroxy: 24 ng/mL — ABNORMAL LOW (ref 30–100)

## 2022-12-17 ENCOUNTER — Ambulatory Visit: Payer: 59 | Admitting: Orthopedic Surgery

## 2022-12-31 ENCOUNTER — Telehealth: Payer: Self-pay | Admitting: Neurology

## 2022-12-31 NOTE — Telephone Encounter (Signed)
LVM and sent mychart msg informing pt of need to reschedule 02/21/23 appt - MD out

## 2023-01-17 ENCOUNTER — Ambulatory Visit: Payer: 59 | Admitting: Family

## 2023-01-17 NOTE — Progress Notes (Deleted)
   Patient ID: Edwin Cline, male    DOB: [birth year], 48 y.o.   MRN: [id number]  No chief complaint on file.           Assessment & Plan:   Subjective:    Outpatient Medications Prior to Visit  Medication Sig Dispense Refill   albuterol (VENTOLIN HFA) 108 (90 Base) MCG/ACT inhaler Inhale into the lungs.     amLODipine  (NORVASC ) 10 MG tablet Take 1 tablet (10 mg total) by mouth daily. 90 tablet 1   EPINEPHrine  0.3 mg/0.3 mL IJ SOAJ injection Inject 0.3 mg into the muscle as needed for anaphylaxis. 1 each 0   hydrochlorothiazide  (HYDRODIURIL ) 12.5 MG tablet Take 1 tablet (12.5 mg total) by mouth daily. 90 tablet 1   pregabalin  (LYRICA ) 75 MG capsule Take 1 capsule (75 mg total) by mouth 2 (two) times daily. (Patient not taking: Reported on 11/15/2022) 84 capsule 0   No facility-administered medications prior to visit.   Past Medical History:  Diagnosis Date   Carpal tunnel syndrome    Past Surgical History:  Procedure Laterality Date   BACK SURGERY  2012   LUMBAR LAMINECTOMY/DECOMPRESSION MICRODISCECTOMY Left 04/18/2021   Procedure: REDO MICRODISCECTOMY Left L5-S1;  Surgeon: Lucilla Lynwood BRAVO, MD;  Location: [facility] OR;  Service: Orthopedics;  Laterality: Left;   Allergies  Allergen Reactions   Shrimp Flavor [Flavoring Agent] Hives      Objective:    Physical Exam Vitals and nursing note reviewed.  Constitutional:      General: He is not in acute distress.    Appearance: Normal appearance.  HENT:     Head: Normocephalic.  Cardiovascular:     Rate and Rhythm: Normal rate and regular rhythm.  Pulmonary:     Effort: Pulmonary effort is normal.     Breath sounds: Normal breath sounds.  Musculoskeletal:        General: Normal range of motion.     Cervical back: Normal range of motion.  Skin:    General: Skin is warm and dry.  Neurological:     Mental Status: He is alert and oriented to person, place, and time.  Psychiatric:        Mood and Affect: Mood normal.    There  were no vitals taken for this visit. Wt Readings from Last 3 Encounters:  11/15/22 232 lb 6.4 oz (105.4 kg)  11/05/22 231 lb (104.8 kg)  10/04/22 230 lb 9.6 oz (104.6 kg)       Lucius Krabbe, NP

## 2023-02-08 IMAGING — CR DG LUMBAR SPINE 2-3V
3 series · 3 of 3 positions shown · non-contrast
Comparison: Portable cross-table lateral views compared to
04/13/2021, which demonstrates 5 lumbar type vertebra.

CLINICAL DATA: L5-S1 LEFT micro diskectomy

EXAM:
LUMBAR SPINE - 2-3 VIEW

[lateral (1 of 3)]
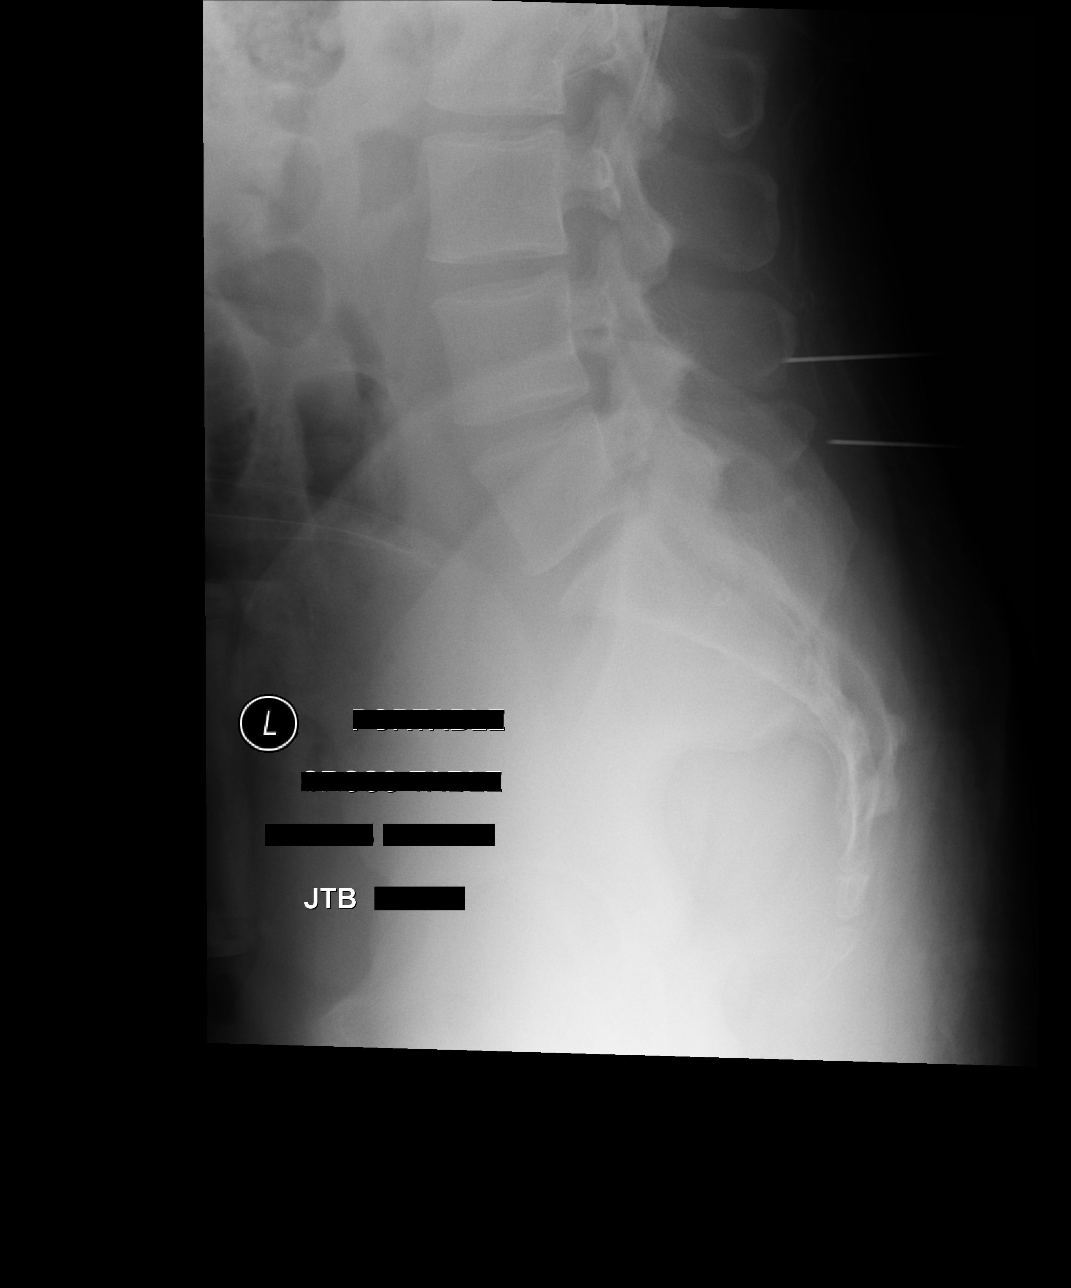

[lateral (2 of 3)]
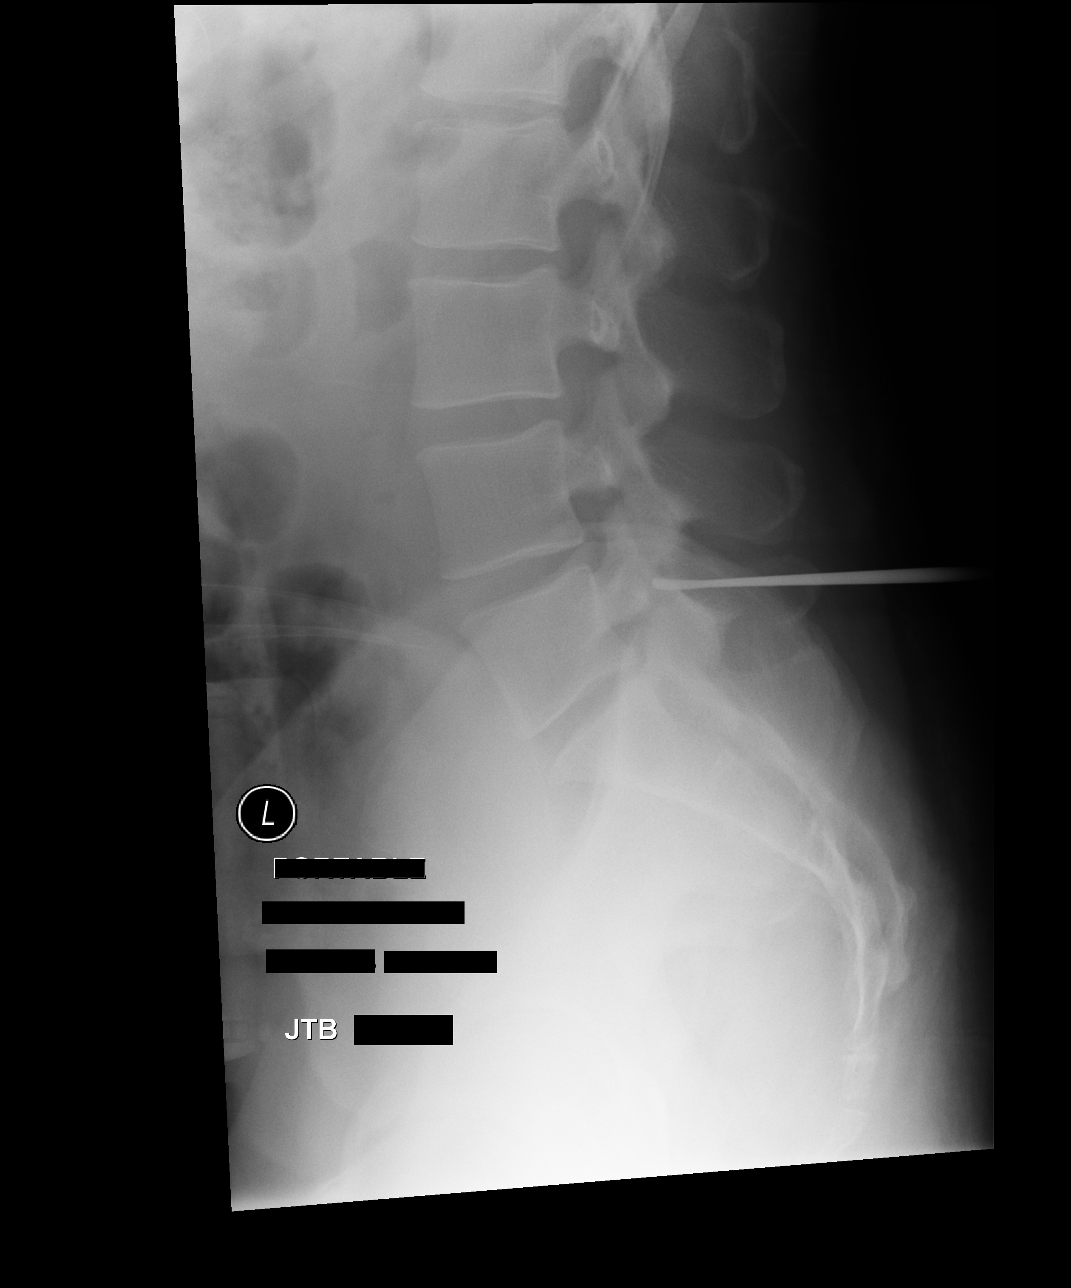

[lateral (3 of 3)]
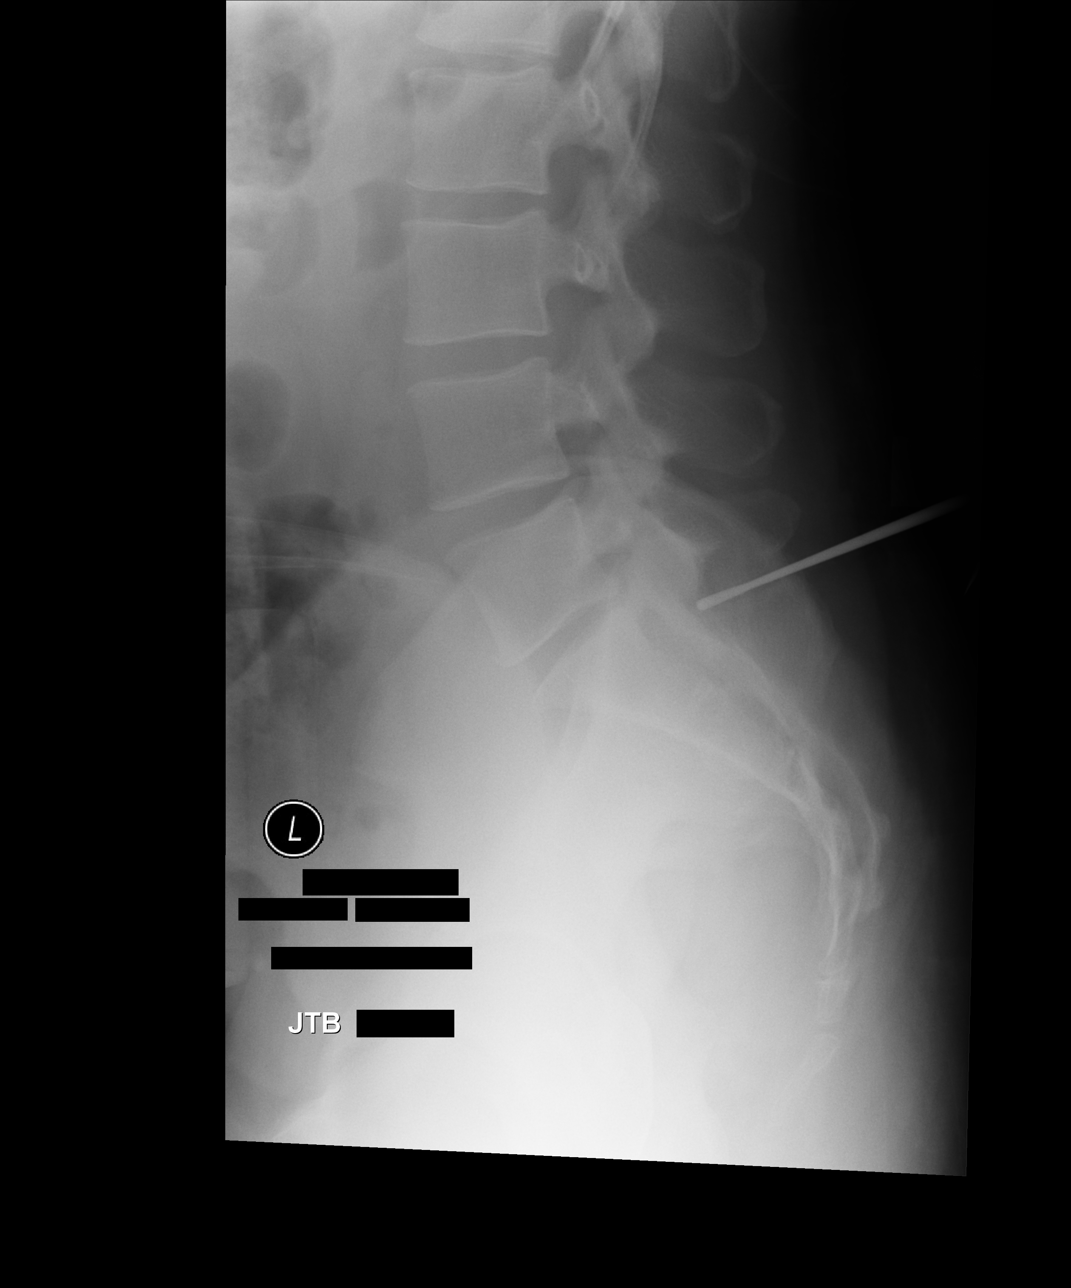

[3 of 3 positions shown; findings below may reference images not displayed]

FINDINGS: Image 1 at 4277 hours: 2 metallic probes via dorsal approach project
dorsal to spinous processes of L4 and L5

Image 2 at 7101 hours: Metallic probe projects dorsal to the mid L5

Image 3 at 2922 hours: Metallic probe via dorsal approach projects
dorsal to the L5-S1 disc space level.
IMPRESSION: Lumbar localization images as above.

## 2023-02-21 ENCOUNTER — Ambulatory Visit: Payer: 59 | Admitting: Family

## 2023-02-21 ENCOUNTER — Encounter: Payer: Self-pay | Admitting: Family

## 2023-02-21 ENCOUNTER — Ambulatory Visit: Payer: 59 | Admitting: Neurology

## 2023-02-21 VITALS — BP 118/84 | HR 82 | Temp 97.8°F | Ht 75.0 in | Wt 219.6 lb

## 2023-02-21 DIAGNOSIS — R7303 Prediabetes: Secondary | ICD-10-CM

## 2023-02-21 DIAGNOSIS — J1000 Influenza due to other identified influenza virus with unspecified type of pneumonia: Secondary | ICD-10-CM

## 2023-02-21 DIAGNOSIS — R112 Nausea with vomiting, unspecified: Secondary | ICD-10-CM | POA: Diagnosis not present

## 2023-02-21 MED ORDER — PREDNISONE 10 MG PO TABS
ORAL_TABLET | ORAL | 0 refills | Status: DC
Start: 2023-02-21 — End: 2023-11-04

## 2023-02-21 MED ORDER — ONDANSETRON HCL 4 MG PO TABS: 4.0000 mg | ORAL_TABLET | Freq: Three times a day (TID) | ORAL | 0 refills | Status: AC | PRN

## 2023-02-21 NOTE — Patient Instructions (Signed)
 It was very nice to see you today!   I have sent over prednisone , a steroid, to help your cough and fatigue.  DO NOT take any Ibuprofen , Advil , or Aleve  while taking the prednisone . Tylenol  is ok. You have to increase your water intake to 2 liters daily = 8, 8 oz cups starting in the morning. Use your inhaler in the mornings, around lunch and 1 hour before bedtime for the next 3-4 days to help with the coughing, wheezing and any shortness of breath. It can take another 2 weeks to completely recover from having pneumonia, be patient, ok to increase your activity as tolerated.  Your A1C is 6.1 which is better than last time, good news! But you are considered borderline and need to reduce your intake of carbohydrates and sweets.   Be sure you have a regular follow up visit schedule for your blood pressure.         PLEASE NOTE:  If you had any lab tests please let us  know if you have not heard back within a few days. You may see your results on MyChart before we have a chance to review them but we will give you a call once they are reviewed by us . If we ordered any referrals today, please let us  know if you have not heard from their office within the next week.

## 2023-02-21 NOTE — Progress Notes (Signed)
 Patient ID: Edwin Cline, male    DOB: 03/29/1975, 48 y.o.   MRN: [id number]  Chief Complaint  Patient presents with   Follow-up    Pt was seen in ED on 02/13/2023 for pneumonia and Influenza A. Pt c/o fatigue,weakness and nausea. Finished tamiflu and Amoxicillin.       Discussed the use of AI scribe software for clinical note transcription with the patient, who gave verbal consent to proceed.  History of Present Illness   Edwin Cline is a 48 year old male who presents with persistent cough and nausea following a recent diagnosis of pneumonia. Initially diagnosed with the flu in the ED he was started on Tamiflu, which caused nausea. His symptoms worsened, leading to a 2nd emergency room visit where he was diagnosed with pneumonia. He was prescribed two antibiotics, Augmentin & Zpack, which he completed over five days starting February 12, 2023. Since completing the antibiotics, he has experienced some improvement but continues to have a some cough with mucus production, which has changed from brown to clear and a lot of fatigue, mild nausea. He experiences ongoing nausea and occasional dizziness. He was prescribed ondansetron  for nausea, which he takes as needed. He also feels tired and uses an inhaler to help with breathing difficulties. He experiences shortness of breath and chest tightness, particularly when walking. His past medical history includes hypertension, for which he is taking amlodipine . He was noted to have an A1c of 6.5% in October 2024, indicating diabetes, but he has not been formally diagnosed or treated for it. No previous diagnosis of diabetes or prediabetes.     Assessment & Plan:     Flu induced Pneumonia - Recent diagnosis with persistent cough and phlegm production. Phlegm has transitioned from brown to clear. Completed a 5-day course of Augmentin and Azithromycin. Lungs still congested on exam. -Start Prednisone  taper dose pack for anti-inflammatory effect and to  suppress cough, advised on use & SE. Avoid OTC NSAIDs while taking this. -Continue using inhaler three times daily for the next 3-4 days to aid in breathing, mucus clearance. -Encouraged increased hydration, 2L water daily. -Call office if sx are not improved or worsening after finishing prednisone . Pt advised it may take 2-3 weeks for fatigue to completely resolve.  Nausea - Reported nausea, possibly secondary to recent illness and medications. -Continue Ondansetron  as needed for nausea, sending refill.  Borderline Diabetes - New - Previous A1C of 6.5 in October, indicating potential diabetes. Today A1C is 6.1.  -Advised pt on low carbohydrate diet including sweets, increase exercise when feeling better, continue to work on weight loss. -F/U in 3 mos     Subjective:    Outpatient Medications Prior to Visit  Medication Sig Dispense Refill   albuterol (VENTOLIN HFA) 108 (90 Base) MCG/ACT inhaler Inhale into the lungs.     amLODipine  (NORVASC ) 10 MG tablet Take 1 tablet (10 mg total) by mouth daily. 90 tablet 1   EPINEPHrine  0.3 mg/0.3 mL IJ SOAJ injection Inject 0.3 mg into the muscle as needed for anaphylaxis. 1 each 0   hydrochlorothiazide  (HYDRODIURIL ) 12.5 MG tablet Take 1 tablet (12.5 mg total) by mouth daily. 90 tablet 1   No facility-administered medications prior to visit.   Past Medical History:  Diagnosis Date   Carpal tunnel syndrome    Past Surgical History:  Procedure Laterality Date   BACK SURGERY  2012   LUMBAR LAMINECTOMY/DECOMPRESSION MICRODISCECTOMY Left 04/18/2021   Procedure: REDO MICRODISCECTOMY Left L5-S1;  Surgeon: Lucilla Agent  E, MD;  Location: [facility] OR;  Service: Orthopedics;  Laterality: Left;   Allergies  Allergen Reactions   Shrimp Flavor [Flavoring Agent (Non-Screening)] Hives      Objective:    Physical Exam Vitals and nursing note reviewed.  Constitutional:      General: He is not in acute distress.    Appearance: Normal appearance.  HENT:      Head: Normocephalic.  Cardiovascular:     Rate and Rhythm: Normal rate and regular rhythm.  Pulmonary:     Effort: Pulmonary effort is normal.     Breath sounds: Examination of the right-upper field reveals rhonchi. Examination of the left-upper field reveals rhonchi. Examination of the right-middle field reveals rhonchi. Examination of the left-middle field reveals rhonchi. Rhonchi present.  Musculoskeletal:        General: Normal range of motion.     Cervical back: Normal range of motion.  Skin:    General: Skin is warm and dry.  Neurological:     Mental Status: He is alert and oriented to person, place, and time.  Psychiatric:        Mood and Affect: Mood normal.    BP 118/84 (BP Location: Left Arm, Patient Position: Sitting, Cuff Size: Large)   Pulse 82   Temp 97.8 F (36.6 C) (Temporal)   Ht 6' 3 (1.905 m)   Wt 219 lb 9.6 oz (99.6 kg)   SpO2 96%   BMI 27.45 kg/m  Wt Readings from Last 3 Encounters:  02/21/23 219 lb 9.6 oz (99.6 kg)  11/15/22 232 lb 6.4 oz (105.4 kg)  11/05/22 231 lb (104.8 kg)      Lucius Krabbe, NP

## 2023-02-21 NOTE — Assessment & Plan Note (Addendum)
 New - Previous A1C of 6.5 in October, indicating potential diabetes, pt never came in for f/u. Today A1C is 6.1.  -Advised pt on low carbohydrate diet including sweets, increase exercise when feeling better, continue to work on weight loss. -F/U in 3 mos

## 2023-02-27 ENCOUNTER — Other Ambulatory Visit: Payer: Self-pay | Admitting: Family

## 2023-02-27 DIAGNOSIS — I10 Essential (primary) hypertension: Secondary | ICD-10-CM

## 2023-02-27 NOTE — Telephone Encounter (Signed)
Last Fill: Hydrochlorothiazide: 10/04/22     Amlodipine:10/04/22  Last OV: 02/21/23 Next OV: 04/03/23  Routing to provider for review/authorization.

## 2023-02-27 NOTE — Telephone Encounter (Signed)
Copied from CRM [id number]. Topic: Clinical - Medication Refill >> Feb 27, 2023  2:03 PM Isabell A wrote: Most Recent Primary Care Visit:  Provider: Dulce Sellar  Department: [facility]  Visit Type: HOSPITAL FOLLOW UP  Date: 02/21/2023  Medication: hydrochlorothiazide (HYDRODIURIL) 12.5 MG tablet amLODipine (NORVASC) 10 MG tablet    Has the patient contacted their pharmacy? No, forgot to ask for refills at appnt.  (Agent: If no, request that the patient contact the pharmacy for the refill. If patient does not wish to contact the pharmacy document the reason why and proceed with request.) (Agent: If yes, when and what did the pharmacy advise?)  Is this the correct pharmacy for this prescription? Yes If no, delete pharmacy and type the correct one.  This is the patient's preferred pharmacy:  [facility] [phone] - [place], [place] - [address] [place] [address] Phone: [phone] Fax: [phone]   Has the prescription been filled recently? Yes  Is the patient out of the medication? No  Has the patient been seen for an appointment in the last year OR does the patient have an upcoming appointment? Yes  Can we respond through MyChart? Yes  Agent: Please be advised that Rx refills may take up to 3 business days. We ask that you follow-up with your pharmacy.

## 2023-02-28 MED ORDER — AMLODIPINE BESYLATE 10 MG PO TABS: 10.0000 mg | ORAL_TABLET | Freq: Every day | ORAL | 1 refills | Status: DC

## 2023-02-28 MED ORDER — HYDROCHLOROTHIAZIDE 12.5 MG PO TABS: 12.5000 mg | ORAL_TABLET | Freq: Every day | ORAL | 1 refills | Status: DC

## 2023-04-03 ENCOUNTER — Ambulatory Visit: Payer: Self-pay | Admitting: Family

## 2023-04-11 ENCOUNTER — Ambulatory Visit: Admitting: Orthopedic Surgery

## 2023-07-26 ENCOUNTER — Other Ambulatory Visit: Payer: Self-pay | Admitting: Family

## 2023-07-26 DIAGNOSIS — I10 Essential (primary) hypertension: Secondary | ICD-10-CM

## 2023-08-09 ENCOUNTER — Other Ambulatory Visit: Payer: Self-pay | Admitting: Family

## 2023-08-09 DIAGNOSIS — I10 Essential (primary) hypertension: Secondary | ICD-10-CM

## 2023-08-09 NOTE — Telephone Encounter (Signed)
 Copied from CRM [id number]. Topic: Clinical - Medication Refill >> Aug 09, 2023 10:06 AM Gennette ORN wrote: Medication:  hydrochlorothiazide  (HYDRODIURIL ) 12.5 MG tablet and  amLODipine  (NORVASC ) 10 MG tablet   Has the patient contacted their pharmacy? No (Agent: If no, request that the patient contact the pharmacy for the refill. If patient does not wish to contact the pharmacy document the reason why and proceed with request.) (Agent: If yes, when and what did the pharmacy advise?)  This is the patient's preferred pharmacy:   [phone] [place], [place] - [address] [place] [address] Phone: [phone] Fax: [phone]  Is this the correct pharmacy for this prescription? Yes If no, delete pharmacy and type the correct one.   Has the prescription been filled recently? Yes  Is the patient out of the medication? Yes  Has the patient been seen for an appointment in the last year OR does the patient have an upcoming appointment? Yes  Can we respond through MyChart? Yes  Agent: Please be advised that Rx refills may take up to 3 business days. We ask that you follow-up with your pharmacy.

## 2023-08-31 ENCOUNTER — Other Ambulatory Visit: Payer: Self-pay | Admitting: Family

## 2023-08-31 DIAGNOSIS — I10 Essential (primary) hypertension: Secondary | ICD-10-CM

## 2023-09-02 NOTE — Telephone Encounter (Signed)
 Last OV 02/21/23, f/u 3 months Next OV 04/03/23 - NO SHOW  Last refill 07/29/23 - needs OV before next refill  Please reach out to pt to assist with scheduling f/u visit.

## 2023-09-06 ENCOUNTER — Other Ambulatory Visit: Payer: Self-pay | Admitting: Family

## 2023-09-06 DIAGNOSIS — I10 Essential (primary) hypertension: Secondary | ICD-10-CM

## 2023-09-10 ENCOUNTER — Encounter: Payer: Self-pay | Admitting: *Deleted

## 2023-09-27 ENCOUNTER — Other Ambulatory Visit: Payer: Self-pay | Admitting: Family

## 2023-09-27 DIAGNOSIS — I10 Essential (primary) hypertension: Secondary | ICD-10-CM

## 2023-09-27 NOTE — Telephone Encounter (Signed)
 Copied from CRM #[id number]. Topic: Clinical - Medication Refill >> Sep 27, 2023 12:08 PM [place] J wrote: Medication:  amLODipine  (NORVASC ) 10 MG tablet  hydrochlorothiazide  (HYDRODIURIL ) 12.5 MG tablet  Has the patient contacted their pharmacy? Yes (Agent: If no, request that the patient contact the pharmacy for the refill. If patient does not wish to contact the pharmacy document the reason why and proceed with request.) (Agent: If yes, when and what did the pharmacy advise?) Pharmacy sent request on their end but ask patient to request the refill just in case it didn't go through on their end.  This is the patient's preferred pharmacy:  [phone] [place], [place] - [address] [place] [address] Phone: [phone] Fax: [phone]  Is this the correct pharmacy for this prescription? Yes If no, delete pharmacy and type the correct one.   Has the prescription been filled recently? No  Is the patient out of the medication? Yes  Has the patient been seen for an appointment in the last year OR does the patient have an upcoming appointment? Yes  Can we respond through MyChart? Yes  Agent: Please be advised that Rx refills may take up to 3 business days. We ask that you follow-up with your pharmacy.

## 2023-10-19 ENCOUNTER — Other Ambulatory Visit: Payer: Self-pay | Admitting: Family

## 2023-10-19 DIAGNOSIS — I10 Essential (primary) hypertension: Secondary | ICD-10-CM

## 2023-10-24 ENCOUNTER — Other Ambulatory Visit: Payer: Self-pay | Admitting: Family

## 2023-10-24 DIAGNOSIS — I10 Essential (primary) hypertension: Secondary | ICD-10-CM

## 2023-10-28 ENCOUNTER — Ambulatory Visit: Admitting: Orthopedic Surgery

## 2023-10-28 ENCOUNTER — Encounter: Payer: Self-pay | Admitting: Orthopedic Surgery

## 2023-10-28 ENCOUNTER — Other Ambulatory Visit (INDEPENDENT_AMBULATORY_CARE_PROVIDER_SITE_OTHER): Payer: Self-pay

## 2023-10-28 VITALS — BP 141/90 | HR 90 | Ht 75.0 in | Wt 219.6 lb

## 2023-10-28 DIAGNOSIS — Z9889 Other specified postprocedural states: Secondary | ICD-10-CM | POA: Diagnosis not present

## 2023-10-28 MED ORDER — TRAMADOL HCL 50 MG PO TABS: 50.0000 mg | ORAL_TABLET | Freq: Four times a day (QID) | ORAL | 0 refills | Status: AC | PRN

## 2023-10-28 NOTE — Progress Notes (Signed)
 Orthopedic Spine Surgery Office Note   Assessment: Patient is a 48 y.o. male with history of L5/S1 microdiscectomy who has had progressive worsening of radiating left leg pain along the posterior thigh and leg, possible radiculopathy     Plan: -Prescribe tramadol for him.  Provided him with a referral to pain management -Since he has had symptoms for over a year now in spite of conservative treatments, recommended an MRI of the lumbar spine to evaluate for radiculopathy -Treatments tried: Lyrica , Tylenol  -Patient should return to office in 4 weeks, x-rays at next visit: none     Patient expressed understanding of the plan and all questions were answered to the patient's satisfaction.    ___________________________________________________________________________     History:   Patient is a 48 y.o. male who presents today for follow up on his lumbar spine.  Patient has a history of 2 microdiscectomies at L5/S1.  His last microdiscectomy was in 2023.  He was last seen in the office about a year ago.  He was then lost to follow-up.  He comes in today with progressive worsening of his radiating left leg pain.  He feels along the posterior aspect of the thigh and leg to the level of the foot.  He feels it on the plantar aspect of the foot.  He also has numbness and paresthesias in that distribution.  He has no pain radiating into the right lower extremity.  He notes the pain is worse if he is laying flat or sitting for more than a couple of minutes.  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     Treatments tried: tylenol , lyrica      Physical Exam:   General: no acute distress, appears stated age Neurologic: alert, answering questions appropriately, following commands Respiratory: unlabored breathing on room air, symmetric chest rise Psychiatric: appropriate affect, normal cadence to speech     MSK (spine):   -Strength exam                                                    Left                  Right EHL                              4/5                  5/5 TA                                 5/5                  5/5 GSC                             5/5                  5/5 Knee extension            5/5                  5/5 Hip flexion  5/5                  5/5   -Sensory exam                           Sensation intact to light touch in L3-S1 nerve distributions of bilateral lower extremities   -Achilles DTR: 2/4 on the left, 2/4 on the right -Patellar tendon DTR: 2/4 on the left, 2/4 on the right   -Straight leg raise: positive on the left, negative on the right -Femoral nerve stretch test: negative bilaterally -Clonus: no beats bilaterally   Imaging: XRs of the lumbar spine from 10/28/2023 were independently reviewed and interpreted, showing disc height loss with small anterior osteophyte formation at L5/S1.  No other significant degenerative changes seen.  No fracture or dislocation seen.  No evidence of instability on flexion/extension views.    Patient name: Edwin Cline Patient MRN: [id number] Date of visit: 10/28/23

## 2023-10-31 ENCOUNTER — Other Ambulatory Visit: Payer: Self-pay

## 2023-10-31 DIAGNOSIS — I10 Essential (primary) hypertension: Secondary | ICD-10-CM

## 2023-10-31 MED ORDER — HYDROCHLOROTHIAZIDE 12.5 MG PO TABS: 12.5000 mg | ORAL_TABLET | Freq: Every day | ORAL | 0 refills | Status: DC

## 2023-10-31 MED ORDER — AMLODIPINE BESYLATE 10 MG PO TABS: 10.0000 mg | ORAL_TABLET | Freq: Every day | ORAL | 0 refills | Status: DC

## 2023-10-31 NOTE — Telephone Encounter (Signed)
 RX sent

## 2023-10-31 NOTE — Telephone Encounter (Signed)
 Patient's wife called to see why medication wasn't being sent in. Informed caller that appt is needed. Patient has been scheduled for 11/04/23 but will be going out of town on Friday and only has one dose left. Patient's wife is wanting to know if small supply could be sent in prior to appt. Please advise.

## 2023-11-01 ENCOUNTER — Telehealth: Payer: Self-pay | Admitting: Orthopedic Surgery

## 2023-11-01 NOTE — Telephone Encounter (Signed)
 Pts wife came in and dropped off Disability forms to be completed by the provider.  Upon completion he would like for them to be faxed to [facility] [employer] at [phone] and they would also like to be called to get a copy of the forms.  Pt paid via CASH $20.00 (Receipt # [id number])  Forms placed in tray for Tammy to complete.

## 2023-11-04 ENCOUNTER — Encounter: Payer: Self-pay | Admitting: Family

## 2023-11-04 ENCOUNTER — Ambulatory Visit (INDEPENDENT_AMBULATORY_CARE_PROVIDER_SITE_OTHER): Admitting: Family

## 2023-11-04 VITALS — BP 122/72 | HR 78 | Temp 98.0°F | Ht 75.0 in | Wt 230.8 lb

## 2023-11-04 DIAGNOSIS — E782 Mixed hyperlipidemia: Secondary | ICD-10-CM | POA: Diagnosis not present

## 2023-11-04 DIAGNOSIS — I10 Essential (primary) hypertension: Secondary | ICD-10-CM | POA: Diagnosis not present

## 2023-11-04 DIAGNOSIS — Z1159 Encounter for screening for other viral diseases: Secondary | ICD-10-CM

## 2023-11-04 DIAGNOSIS — R7303 Prediabetes: Secondary | ICD-10-CM

## 2023-11-04 DIAGNOSIS — Z114 Encounter for screening for human immunodeficiency virus [HIV]: Secondary | ICD-10-CM | POA: Diagnosis not present

## 2023-11-04 LAB — COMPREHENSIVE METABOLIC PANEL WITH GFR
ALT: 27 U/L (ref 0–53)
AST: 15 U/L (ref 0–37)
Albumin: 4.6 g/dL (ref 3.5–5.2)
Alkaline Phosphatase: 69 U/L (ref 39–117)
BUN: 16 mg/dL (ref 6–23)
CO2: 26 meq/L (ref 19–32)
Calcium: 9.2 mg/dL (ref 8.4–10.5)
Chloride: 102 meq/L (ref 96–112)
Creatinine, Ser: 1.21 mg/dL (ref 0.40–1.50)
GFR: 70.15 mL/min (ref >60.00)
Glucose, Bld: 132 mg/dL — ABNORMAL HIGH (ref 70–99)
Potassium: 3.8 meq/L (ref 3.5–5.1)
Sodium: 138 meq/L (ref 135–145)
Total Bilirubin: 0.5 mg/dL (ref 0.2–1.2)
Total Protein: 7.3 g/dL (ref 6.0–8.3)

## 2023-11-04 LAB — LIPID PANEL
Cholesterol: 186 mg/dL (ref 0–200)
HDL: 30.9 mg/dL — ABNORMAL LOW (ref >39.00)
LDL Cholesterol: 108 mg/dL — ABNORMAL HIGH (ref 0–99)
NonHDL: 155.25
Total CHOL/HDL Ratio: 6
Triglycerides: 237 mg/dL — ABNORMAL HIGH (ref 0.0–149.0)
VLDL: 47.4 mg/dL — ABNORMAL HIGH (ref 0.0–40.0)

## 2023-11-04 LAB — HEMOGLOBIN A1C: Hgb A1c MFr Bld: 6.3 % (ref 4.6–6.5)

## 2023-11-04 MED ORDER — ROSUVASTATIN CALCIUM 5 MG PO TABS: 5.0000 mg | ORAL_TABLET | ORAL | 1 refills | Status: AC

## 2023-11-04 MED ORDER — AMLODIPINE BESYLATE 10 MG PO TABS: 10.0000 mg | ORAL_TABLET | Freq: Every day | ORAL | 1 refills | Status: AC

## 2023-11-04 MED ORDER — HYDROCHLOROTHIAZIDE 12.5 MG PO TABS: 12.5000 mg | ORAL_TABLET | Freq: Every day | ORAL | 1 refills | Status: AC

## 2023-11-04 NOTE — Progress Notes (Signed)
 Patient ID: Edwin Cline, male    DOB: 1975-08-02, 48 y.o.   MRN: [id number]  Chief Complaint  Patient presents with   Hypertension    Pt did not take Amlodipine  and hydrochlorothiazide  this morning   Prediabetes  Discussed the use of AI scribe software for clinical note transcription with the patient, who gave verbal consent to proceed.  History of Present Illness Edwin Cline is a 48 year old male with hypertension and borderline diabetes who presents for follow-up and lab work.  He has gained eleven pounds recently and is concerned about his blood sugar levels, with a previous A1c of 6.5. He has not been taking his medication regularly due to running out and work commitments, which involve long periods away from home. He takes a 'little peach pill' in the mornings and amlodipine  10 mg for blood pressure, which is stable without headaches or dizziness. He completed a course of prednisone . He acknowledges high cholesterol and the need to adjust his diet, particularly reducing carbs and sweets. 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  Assessment & Plan Essential hypertension Blood pressure well-controlled today on Amlodipine  10mg  & HCTZ 12.5mg  qd, did not take either med this am.  - Check CMP today. - Send 90-day supply of both meds with a refill for 6 months. - Emphasized importance of daily medication adherence. - F/U in 4 mos  Prediabetes Previous A1c borderline at 6.5. Weight gain of 11lbs since last year, increases risk of progression to type 2 diabetes. - Order A1c test to assess current glycemic control. - Discuss potential initiation of metformin if A1c elevated. - Advise on dietary changes: reduce white carbohydrates, increase fiber, avoid sweets, consume fruit without added sugar. - F/U in 4 months  Mixed hyperlipidemia Previously high cholesterol levels increase risk for cardiac events, especially with prediabetes. -  Order lipid panel. - Prescribe low-dose Crestor 5mg  start 3 days a week initially, then adjust to every other day or daily if tolerated. - F/U in 1 year  General Health Maintenance Discussed importance of flu vaccine to prevent influenza and complications. - Recommend receiving the flu vaccine.  Subjective:    Outpatient Medications Prior to Visit  Medication Sig Dispense Refill   albuterol (VENTOLIN HFA) 108 (90 Base) MCG/ACT inhaler Inhale into the lungs.     amLODipine  (NORVASC ) 10 MG tablet Take 1 tablet (10 mg total) by mouth daily. Please schedule office visit for further refills. 30 tablet 0   EPINEPHrine  0.3 mg/0.3 mL IJ SOAJ injection Inject 0.3 mg into the muscle as needed for anaphylaxis. 1 each 0   hydrochlorothiazide  (HYDRODIURIL ) 12.5 MG tablet Take 1 tablet (12.5 mg total) by mouth daily. PLEASE SCHEDULE FOLLOW UP FOR FURTHER REFILLS. 30 tablet 0   ondansetron  (ZOFRAN ) 4 MG tablet Take 1 tablet (4 mg total) by mouth every 8 (eight) hours as needed for nausea or vomiting. 20 tablet 0   predniSONE  (DELTASONE ) 10 MG tablet Take with breakfast:  6 tab day 1, 5 tab day 2-3, 4 tab day 4, 3 tab day 5 23 tablet 0   No facility-administered medications prior to visit.   Past Medical History:  Diagnosis Date   Carpal tunnel syndrome    Past Surgical History:  Procedure Laterality Date   BACK SURGERY  2012   LUMBAR LAMINECTOMY/DECOMPRESSION MICRODISCECTOMY Left 04/18/2021   Procedure: REDO MICRODISCECTOMY Left L5-S1;  Surgeon: Edwin Lynwood BRAVO, MD;  [facility]: [facility] OR;  Service: Orthopedics;  Laterality: Left;   Allergies  Allergen Reactions   Shrimp Flavor [Flavoring Agent (Non-Screening)] Hives      Objective:    Physical Exam Vitals and nursing note reviewed.  Constitutional:      General: He is not in acute distress.    Appearance: Normal appearance. He is obese.  HENT:     Head: Normocephalic.  Cardiovascular:     Rate and Rhythm: Normal rate and regular rhythm.   Pulmonary:     Effort: Pulmonary effort is normal.     Breath sounds: Normal breath sounds.  Musculoskeletal:        General: Normal range of motion.     Cervical back: Normal range of motion.  Skin:    General: Skin is warm and dry.  Neurological:     Mental Status: He is alert and oriented to person, place, and time.  Psychiatric:        Mood and Affect: Mood normal.    BP 122/72 (BP Location: Left Arm, Patient Position: Sitting, Cuff Size: Large)   Pulse 78   Temp 98 F (36.7 C) (Temporal)   Ht 6' 3 (1.905 m)   Wt 230 lb 12.8 oz (104.7 kg)   SpO2 98%   BMI 28.85 kg/m  Wt Readings from Last 3 Encounters:  11/04/23 230 lb 12.8 oz (104.7 kg)  10/28/23 219 lb 9.6 oz (99.6 kg)  02/21/23 219 lb 9.6 oz (99.6 kg)      Edwin Krabbe, NP

## 2023-11-04 NOTE — Telephone Encounter (Signed)
 Forms completed and faxed. Copy mailed to pt

## 2023-11-05 LAB — HIV ANTIBODY (ROUTINE TESTING W REFLEX)
HIV 1&2 Ab, 4th Generation: NONREACTIVE
HIV FINAL INTERPRETATION: NEGATIVE

## 2023-11-05 LAB — HEPATITIS C ANTIBODY: Hepatitis C Ab: NONREACTIVE

## 2023-11-07 ENCOUNTER — Ambulatory Visit: Payer: Self-pay | Admitting: Family

## 2023-11-18 ENCOUNTER — Encounter: Payer: Self-pay | Admitting: Radiology

## 2023-11-19 ENCOUNTER — Telehealth: Payer: Self-pay | Admitting: Orthopedic Surgery

## 2023-11-19 NOTE — Telephone Encounter (Signed)
 Pt called stating he need an update letter for work. MRI is not reschedule until 11/10 and need an appt for [facility] after for result reading. Please call pt about this matter at [phone].

## 2023-11-20 ENCOUNTER — Telehealth: Payer: Self-pay | Admitting: Orthopedic Surgery

## 2023-11-20 ENCOUNTER — Encounter: Payer: Self-pay | Admitting: Radiology

## 2023-11-20 NOTE — Telephone Encounter (Signed)
11/24 @1030 

## 2023-11-20 NOTE — Telephone Encounter (Signed)
 Note written and sent via MyChart per pt request

## 2023-11-20 NOTE — Telephone Encounter (Signed)
Letter faxed via Epic.

## 2023-11-20 NOTE — Telephone Encounter (Signed)
 Pt called wanting a letter where the Dr adria him out of work faxed to your employer. Employers fax number is [phone].  Claim nuber is [id number]. Pt call back number is [phone]

## 2023-11-25 ENCOUNTER — Ambulatory Visit

## 2023-11-25 ENCOUNTER — Telehealth: Payer: Self-pay | Admitting: Orthopedic Surgery

## 2023-11-25 DIAGNOSIS — M4727 Other spondylosis with radiculopathy, lumbosacral region: Secondary | ICD-10-CM | POA: Diagnosis not present

## 2023-11-25 DIAGNOSIS — M5416 Radiculopathy, lumbar region: Secondary | ICD-10-CM | POA: Diagnosis not present

## 2023-11-25 DIAGNOSIS — Z9889 Other specified postprocedural states: Secondary | ICD-10-CM

## 2023-11-25 DIAGNOSIS — M4726 Other spondylosis with radiculopathy, lumbar region: Secondary | ICD-10-CM | POA: Diagnosis not present

## 2023-11-25 DIAGNOSIS — M48061 Spinal stenosis, lumbar region without neurogenic claudication: Secondary | ICD-10-CM | POA: Diagnosis not present

## 2023-11-25 NOTE — Telephone Encounter (Signed)
 I called and advised him of Dr. Jeraline message and gave him the phone number to cal and check the status of the referral.

## 2023-11-25 NOTE — Telephone Encounter (Signed)
 Patient says the tramadol is not working

## 2023-11-28 ENCOUNTER — Telehealth: Payer: Self-pay | Admitting: Orthopedic Surgery

## 2023-11-28 NOTE — Telephone Encounter (Signed)
 Received vm from patient stating needs forms updated as he is still oow. IC,lmvm advising forms updated and faxed with 11/20/23 work note

## 2023-12-09 ENCOUNTER — Ambulatory Visit: Admitting: Orthopedic Surgery

## 2023-12-09 DIAGNOSIS — M5416 Radiculopathy, lumbar region: Secondary | ICD-10-CM | POA: Diagnosis not present

## 2023-12-09 NOTE — Progress Notes (Signed)
 Orthopedic Spine Surgery Office Note   Assessment: Patient is a 48 y.o. male with history of L5/S1 microdiscectomy who has had progressive worsening of radiating left leg pain along the posterior thigh and leg.  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  Referral provided to them today -Treatments tried: Lyrica , Tylenol  -Patient should return to office in 6 weeks, x-rays at next visit: none     Patient expressed understanding of the plan and all questions were answered to the patient's satisfaction.    ___________________________________________________________________________     History:   Patient is a 48 y.o. male who presents today for follow up on his lumbar spine.  Patient continues to have significant low back pain that radiates into the left lower extremity.  He feels along the posterior aspect of the thigh and leg to the level of the foot.  He has not noticed any changes in his symptoms since he was last office.  He comes in today to follow-up on his MRI.  Patient rates the pain as a 7 out of 10 at its worst.    Treatments tried: tylenol , lyrica      Physical Exam:   General: no acute distress, appears stated age Neurologic: alert, answering questions appropriately, following commands Respiratory: unlabored breathing on room air, symmetric chest rise Psychiatric: appropriate affect, normal cadence to speech     MSK (spine):   -Strength exam                                                   Left                  Right EHL                              4/5                  5/5 TA                                 5/5                  5/5 GSC                             5/5                  5/5 Knee extension            5/5                  5/5 Hip flexion                    5/5                  5/5    -Sensory exam                           Sensation intact to light touch in L3-S1 nerve distributions of bilateral lower  extremities   Imaging: XRs of the lumbar spine from 10/28/2023 were previously independently reviewed and interpreted, showing disc height loss with small anterior osteophyte formation at L5/S1.  No other significant degenerative changes seen.  No fracture or dislocation seen.  No evidence of instability on flexion/extension views.  MRI of the lumbar spine from 11/25/2023 was independently reviewed and interpreted, showing mild foraminal stenosis on the left at L5/S1.  No other significant stenosis seen.  DDD at L5/S1.  No other significant degenerative disc disease seen.     Patient name: Edwin Cline Patient MRN: [id number] Date of visit: 12/09/23

## 2023-12-18 ENCOUNTER — Telehealth: Payer: Self-pay | Admitting: Orthopedic Surgery

## 2023-12-18 NOTE — Telephone Encounter (Signed)
 Patient was sent to [facility]. They need the records sent over to them. [phone] is the fax number

## 2023-12-20 ENCOUNTER — Telehealth: Payer: Self-pay | Admitting: Orthopedic Surgery

## 2023-12-20 NOTE — Telephone Encounter (Signed)
 Referral faxed to [phone]

## 2023-12-20 NOTE — Telephone Encounter (Signed)
 Received call from patient. Needs 12/09/23 notes faxed to [facility] and also to [facility] (his other STD). I faxed notes and revised form to [employer] [phone] and [facility] [phone] attn Cassandra. VERBAL AUTH TAKEN

## 2023-12-23 ENCOUNTER — Telehealth: Payer: Self-pay | Admitting: Orthopedic Surgery

## 2023-12-23 MED ORDER — TRAMADOL HCL 50 MG PO TABS: 50.0000 mg | ORAL_TABLET | Freq: Four times a day (QID) | ORAL | 0 refills | Status: AC | PRN

## 2023-12-23 NOTE — Addendum Note (Signed)
 Addended by: GEORGINA SHARPER on: 12/23/2023 02:51 PM   Modules accepted: Orders

## 2023-12-23 NOTE — Telephone Encounter (Signed)
 Patient called and said can he get some medication until he is called to make an appointment for pain management. CB#[phone]

## 2024-01-22 ENCOUNTER — Telehealth: Payer: Self-pay | Admitting: Orthopedic Surgery

## 2024-01-22 NOTE — Telephone Encounter (Signed)
 Received a new form from pts employer. Patient last seen 12/09/23-work note provided stated oow x 6 weeks. That ends today. Please advise. Thank you!

## 2024-01-23 NOTE — Telephone Encounter (Signed)
 Form completed and faxed.

## 2024-01-24 ENCOUNTER — Telehealth: Payer: Self-pay | Admitting: Orthopedic Surgery

## 2024-01-24 NOTE — Telephone Encounter (Signed)
 Received vm from patient. 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 He stated he hasn't been to pain management. He states he cannot work. Please call patient [phone]

## 2024-01-24 NOTE — Telephone Encounter (Signed)
 Patient is here. Says he got a note to return to work but he is having surgery. Would like to talk to someone.

## 2024-01-24 NOTE — Telephone Encounter (Signed)
Duplicate message. See other note

## 2024-01-27 ENCOUNTER — Encounter: Payer: Self-pay | Admitting: Family

## 2024-01-27 ENCOUNTER — Encounter: Payer: Self-pay | Admitting: Radiology

## 2024-01-27 ENCOUNTER — Telehealth: Payer: Self-pay | Admitting: Orthopedic Surgery

## 2024-01-27 ENCOUNTER — Ambulatory Visit (INDEPENDENT_AMBULATORY_CARE_PROVIDER_SITE_OTHER): Admitting: Family

## 2024-01-27 VITALS — BP 110/80 | HR 83 | Temp 97.5°F | Ht 75.0 in | Wt 227.6 lb

## 2024-01-27 DIAGNOSIS — J208 Acute bronchitis due to other specified organisms: Secondary | ICD-10-CM

## 2024-01-27 MED ORDER — AZITHROMYCIN 250 MG PO TABS: ORAL_TABLET | ORAL | 0 refills | Status: AC

## 2024-01-27 MED ORDER — PREDNISONE 10 MG PO TABS: ORAL_TABLET | ORAL | 0 refills | Status: AC

## 2024-01-27 NOTE — Telephone Encounter (Signed)
 Yes ma'am. Those were updated and faxed this morning @ 9:50. Updated forms in media. Thank you!!

## 2024-01-27 NOTE — Telephone Encounter (Signed)
 [employer] form update with [employer] date 02/16/24. Faxed [phone]

## 2024-01-27 NOTE — Progress Notes (Signed)
 "  Patient ID: Edwin Cline, male    DOB: 04-Oct-1975, 49 y.o.   MRN: [id number]  Chief Complaint  Patient presents with   Cough    Pt c/o Cough with brown mucus, present for 3 weeks.   Discussed the use of AI scribe software for clinical note transcription with the patient, who gave verbal consent to proceed.  History of Present Illness Edwin Cline is a 49 year old male with asthma who presents with persistent chest congestion and cough.  He reports ongoing chest congestion with creamy dark mucus, chest tightness, and shortness of breath. Symptoms were initially worse in the daytime but are now more pronounced at night, especially when lying down, and are prominent on waking in the morning. He intermittently uses Mucinex, which helps loosen mucus, and has used an albuterol inhaler in the past. He has also used prednisone  in the past for similar  flares. He has not done home COVID or flu testing.  Assessment & Plan Acute bronchitis Persistent cough for over 3w, w/chest symptoms with mucus production, fatigue, possibly bacterial now. Symptoms include chest tightness and productive cough. - Encouraged use of albuterol inhaler for morning and night use. - Prescribed Zpack x 5d. - Prescribed prednisone  taper pack, morning dose after breakfast. - Advised against NSAIDs with prednisone ; Tylenol  allowed. - Encouraged fluid intake of at least 2.5 liters daily. - Can continue Mucinex DM if effective. - Advised future home flu and COVID testing; can initiate antiviral if positive. - Call back if sx are not improved after medications.  Subjective:    Outpatient Medications Prior to Visit  Medication Sig Dispense Refill   albuterol (VENTOLIN HFA) 108 (90 Base) MCG/ACT inhaler Inhale into the lungs.     amLODipine  (NORVASC ) 10 MG tablet Take 1 tablet (10 mg total) by mouth daily. Please schedule office visit for further refills. 90 tablet 1   EPINEPHrine  0.3 mg/0.3 mL IJ SOAJ injection Inject  0.3 mg into the muscle as needed for anaphylaxis. 1 each 0   hydrochlorothiazide  (HYDRODIURIL ) 12.5 MG tablet Take 1 tablet (12.5 mg total) by mouth daily. PLEASE SCHEDULE FOLLOW UP FOR FURTHER REFILLS. 90 tablet 1   ondansetron  (ZOFRAN ) 4 MG tablet Take 1 tablet (4 mg total) by mouth every 8 (eight) hours as needed for nausea or vomiting. 20 tablet 0   rosuvastatin  (CRESTOR ) 5 MG tablet Take 1 tablet (5 mg total) by mouth as directed. Start with 1 pill on Monday,Wed, Fri. for 2 weeks, then increase to 1 pill qod x 2w, then 1 pill qd 90 tablet 1   No facility-administered medications prior to visit.   Past Medical History:  Diagnosis Date   Carpal tunnel syndrome    Past Surgical History:  Procedure Laterality Date   BACK SURGERY  2012   LUMBAR LAMINECTOMY/DECOMPRESSION MICRODISCECTOMY Left 04/18/2021   Procedure: REDO MICRODISCECTOMY Left L5-S1;  Surgeon: Lucilla Lynwood BRAVO, MD;  Location: [facility] OR;  Service: Orthopedics;  Laterality: Left;   Allergies[1]    Objective:    Physical Exam Vitals and nursing note reviewed.  Constitutional:      General: He is not in acute distress.    Appearance: Normal appearance.  HENT:     Head: Normocephalic.  Cardiovascular:     Rate and Rhythm: Normal rate and regular rhythm.  Pulmonary:     Effort: Pulmonary effort is normal.     Breath sounds: Examination of the right-lower field reveals rhonchi. Examination of the left-lower field reveals rhonchi. Rhonchi  present.  Musculoskeletal:        General: Normal range of motion.     Cervical back: Normal range of motion.  Skin:    General: Skin is warm and dry.  Neurological:     Mental Status: He is alert and oriented to person, place, and time.  Psychiatric:        Mood and Affect: Mood normal.    BP 110/80 (BP Location: Left Arm, Patient Position: Sitting, Cuff Size: Large)   Pulse 83   Temp (!) 97.5 F (36.4 C) (Temporal)   Ht 6' 3 (1.905 m)   Wt 227 lb 9.6 oz (103.2 kg)   SpO2 96%   BMI  28.45 kg/m  Wt Readings from Last 3 Encounters:  01/27/24 227 lb 9.6 oz (103.2 kg)  11/04/23 230 lb 12.8 oz (104.7 kg)  10/28/23 219 lb 9.6 oz (99.6 kg)       Lucius Krabbe, NP     [1]  Allergies Allergen Reactions   Shrimp Flavor [Flavoring Agent (Non-Screening)] Hives   "

## 2024-01-27 NOTE — Telephone Encounter (Signed)
 Pt request a out of work note

## 2024-02-04 ENCOUNTER — Ambulatory Visit: Admitting: Rehabilitative and Restorative Service Providers"

## 2024-02-13 ENCOUNTER — Ambulatory Visit: Admitting: Orthopedic Surgery

## 2024-02-13 DIAGNOSIS — M545 Low back pain, unspecified: Secondary | ICD-10-CM | POA: Diagnosis not present

## 2024-02-13 NOTE — Progress Notes (Signed)
 Orthopedic Spine Surgery Office Note   Assessment: Patient is a 49 y.o. male with history of L5/S1 microdiscectomy who has had progressive worsening of radiating left leg pain along the posterior thigh and leg.  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 Majority of his pain is in the back     Plan: -Continue with pain management -Ordered a SPECT scan to evaluate for discogenic back pain -I told him that surgery is not predictable at relieving back pain but that would be the only thing that I have to offer at this point so we will see if the SPECT identifies a level with increased radiotracer uptake and we can talk about surgery as an option but again emphasized that it would not predictably provide him with any relief of his back pain -Treatments tried: Lyrica , Tylenol , pain management -Patient should return to office in 4 weeks, x-rays at next visit: AP/lateral/flex/ex lumbar     Patient expressed understanding of the plan and all questions were answered to the patient's satisfaction.    ___________________________________________________________________________     History:   Patient is a 49 y.o. male who presents today for follow up on his lumbar spine.  Patient continues to have significant low back pain. He also is having posterior left thigh and leg pain but the majority of his pain is in the back. He saw [facility] after our last visit and they essentially said follow up with me or pain management. He is tired of this pain and said pain management is not as helpful as he hoped.    Treatments tried: tylenol , lyrica , injections, prednisone , pain management      Physical Exam:   General: no acute distress, appears stated age Neurologic: alert, answering questions appropriately, following commands Respiratory: unlabored breathing on room air, symmetric chest rise Psychiatric: appropriate affect, normal cadence to speech     MSK (spine):   -Strength exam                                                    Left                  Right EHL                              4/5                  5/5 TA                                 5/5                  5/5 GSC                             5/5                  5/5 Knee extension            5/5                  5/5 Hip flexion  5/5                  5/5   -Sensory exam                           Sensation intact to light touch in L3-S1 nerve distributions of bilateral lower extremities   Imaging: XRs of the lumbar spine from 10/28/2023 were previously independently reviewed and interpreted, showing disc height loss with small anterior osteophyte formation at L5/S1.  No other significant degenerative changes seen.  No fracture or dislocation seen.  No evidence of instability on flexion/extension views.   MRI of the lumbar spine from 11/25/2023 was previously independently reviewed and interpreted, showing mild foraminal stenosis on the left at L5/S1.  No other significant stenosis seen.  DDD at L5/S1.  No other significant degenerative disc disease seen.     Patient name: Edwin Cline Patient MRN: [id number] Date of visit: 02/13/24

## 2024-02-27 ENCOUNTER — Other Ambulatory Visit
